# Patient Record
Sex: Female | Born: 1960 | Race: Black or African American | Hispanic: No | Marital: Single | Smoking: Never smoker
Health system: Southern US, Community
[De-identification: ages and names within clinical notes are randomized; demographics above are authoritative.]

## PROBLEM LIST (undated history)

## (undated) DIAGNOSIS — J45909 Unspecified asthma, uncomplicated: Secondary | ICD-10-CM

## (undated) DIAGNOSIS — J302 Other seasonal allergic rhinitis: Secondary | ICD-10-CM

## (undated) DIAGNOSIS — R030 Elevated blood-pressure reading, without diagnosis of hypertension: Secondary | ICD-10-CM

## (undated) DIAGNOSIS — K219 Gastro-esophageal reflux disease without esophagitis: Secondary | ICD-10-CM

## (undated) DIAGNOSIS — N6099 Unspecified benign mammary dysplasia of unspecified breast: Secondary | ICD-10-CM

## (undated) DIAGNOSIS — J069 Acute upper respiratory infection, unspecified: Secondary | ICD-10-CM

## (undated) HISTORY — DX: Acute upper respiratory infection, unspecified: J06.9

## (undated) HISTORY — PX: CHOLECYSTECTOMY: SHX55

## (undated) HISTORY — DX: Gastro-esophageal reflux disease without esophagitis: K21.9

---

## 1987-05-14 HISTORY — PX: CHOLECYSTECTOMY: SHX55

## 1998-02-27 ENCOUNTER — Ambulatory Visit (HOSPITAL_COMMUNITY): Admission: RE | Admit: 1998-02-27 | Discharge: 1998-02-27 | Payer: Self-pay | Admitting: Obstetrics and Gynecology

## 1999-07-23 ENCOUNTER — Other Ambulatory Visit: Admission: RE | Admit: 1999-07-23 | Discharge: 1999-07-23 | Payer: Self-pay | Admitting: Obstetrics and Gynecology

## 2000-08-28 ENCOUNTER — Other Ambulatory Visit: Admission: RE | Admit: 2000-08-28 | Discharge: 2000-08-28 | Payer: Self-pay | Admitting: Obstetrics and Gynecology

## 2007-01-05 ENCOUNTER — Encounter: Admission: RE | Admit: 2007-01-05 | Discharge: 2007-01-05 | Payer: Self-pay | Admitting: Obstetrics and Gynecology

## 2011-04-15 ENCOUNTER — Emergency Department (HOSPITAL_COMMUNITY): Payer: BC Managed Care – PPO

## 2011-04-15 ENCOUNTER — Emergency Department (HOSPITAL_COMMUNITY)
Admission: EM | Admit: 2011-04-15 | Discharge: 2011-04-15 | Disposition: A | Discharge status: Home / Self Care | Payer: BC Managed Care – PPO | Attending: Emergency Medicine | Admitting: Emergency Medicine

## 2011-04-15 ENCOUNTER — Other Ambulatory Visit: Payer: Self-pay

## 2011-04-15 DIAGNOSIS — R0789 Other chest pain: Secondary | ICD-10-CM | POA: Insufficient documentation

## 2011-04-15 DIAGNOSIS — R0602 Shortness of breath: Secondary | ICD-10-CM | POA: Insufficient documentation

## 2011-04-15 DIAGNOSIS — R091 Pleurisy: Secondary | ICD-10-CM | POA: Insufficient documentation

## 2011-04-15 LAB — D-DIMER, QUANTITATIVE: D-Dimer, Quant: <0.22 ug/mL-FEU (ref 0.00–0.48)

## 2011-04-15 MED ORDER — KETOROLAC TROMETHAMINE 60 MG/2ML IM SOLN
60.0000 mg | Freq: Once | INTRAMUSCULAR | Status: AC
  Administered 2011-04-15: 60 mg via INTRAMUSCULAR
  Filled 2011-04-15: qty 2

## 2011-04-15 MED ORDER — OXYCODONE-ACETAMINOPHEN 5-325 MG PO TABS: 2.0000 | ORAL_TABLET | ORAL | Status: AC | PRN

## 2011-04-15 NOTE — ED Provider Notes (Signed)
History     CSN: [id number] Arrival date & time: 04/15/2011 11:18 AM   First MD Initiated Contact with Patient 04/15/11 1635      Chief Complaint  Patient presents with  . Chest Pain  . Shortness of Breath    (Consider location/radiation/quality/duration/timing/severity/associated sxs/prior treatment) Patient is a 50 y.o. female presenting with chest pain and shortness of breath. The history is provided by the patient.  Chest Pain Pertinent negatives for primary symptoms include no fever, no cough and no abdominal pain.  Pertinent negatives for associated symptoms include no diaphoresis and no numbness.    Shortness of Breath  Associated symptoms include chest pain. Pertinent negatives include no fever and no cough.   the patient is a 50 year old female, with no past medical history, who takes no prescribed medications.  She presents to the emergency department complaining of left sided, pleuritic chest pain, with shortness of breath since yesterday evening.  She denies nausea, vomiting, fevers, chills, cough, diaphoresis, or leg swelling.  3 days ago.  She had intermittent left leg cramps followed by right leg cramps.  They have resolved.  She denies taking birth control pills.  She denies recent trips or surgeries.  History reviewed. No pertinent past medical history.  Past Surgical History  Procedure Date  . Cholecystectomy   . Cesarean section 1988    History reviewed. No pertinent family history.  History  Substance Use Topics  . Smoking status: Never Smoker   . Smokeless tobacco: Not on file  . Alcohol Use: Yes    OB History    Grav Para Term Preterm Abortions TAB SAB Ect Mult Living                  Review of Systems  Constitutional: Negative for fever, chills and diaphoresis.  HENT: Negative for congestion and neck pain.   Eyes: Negative for redness.  Respiratory: Negative for cough and chest tightness.   Cardiovascular: Positive for chest pain. Negative  for leg swelling.  Gastrointestinal: Negative for abdominal pain.  Genitourinary: Negative for dysuria.  Musculoskeletal: Negative for back pain.  Skin: Negative for rash.  Neurological: Negative for light-headedness, numbness and headaches.  Psychiatric/Behavioral: Negative for confusion.    Allergies  Penicillins  Home Medications   Current Outpatient Rx  Name Route Sig Dispense Refill  . CETIRIZINE HCL 10 MG PO TABS Oral Take 10 mg by mouth daily.        BP 130/83  Pulse 78  Temp(Src) 97.9 F (36.6 C) (Oral)  Resp 16  SpO2 100%  Physical Exam  Constitutional: She is oriented to person, place, and time. She appears well-developed and well-nourished.  HENT:  Head: Normocephalic and atraumatic.  Eyes: Pupils are equal, round, and reactive to light.  Neck: Normal range of motion.  Cardiovascular: Normal rate, regular rhythm and normal heart sounds.   No murmur heard. Pulmonary/Chest: Effort normal and breath sounds normal. No respiratory distress. She has no wheezes. She has no rales.  Abdominal: Soft. She exhibits no distension and no mass. There is no tenderness. There is no rebound and no guarding.  Musculoskeletal: Normal range of motion. She exhibits no edema and no tenderness.  Neurological: She is alert and oriented to person, place, and time. No cranial nerve deficit.  Skin: Skin is warm and dry. No rash noted. No erythema.  Psychiatric: She has a normal mood and affect. Her behavior is normal.    ED Course  Procedures (including critical care time)  50 year old female, with no risk factors for coronary artery disease, or pulmonary embolism, presents with pleuritic chest pain since last night.  Her EKG does not show a STEMI or irregular to her heart beat.  We will perform a chest x-ray, and laboratory testing, to evaluate for pulmonary embolism, or ACS. We will give her IM Toradol for her symptoms   Labs Reviewed  D-DIMER, QUANTITATIVE  I-STAT TROPONIN I    No results found.   No diagnosis found.  ED ECG REPORT   Date: 04/15/2011  EKG Time: 1124 Rate: 72  Rhythm: normal sinus rhythm,   Intervals:none  ST&T Change:nonspecific T wave changes  Axis: nl  Narrative Interpretation:      Normal sinus rhythm with nonspecific T-wave changes         MDM   Pleuritic chest pain No signs of pulmonary embolism, or ACS.  No pneumonia.  No respiratory distress.  No signs of systemic illness.       Nicholes Stairs, MD 04/15/11 [phone]

## 2011-04-15 NOTE — ED Notes (Signed)
Unable to locate patient. Called in waiting room, lobby, and triage.

## 2011-04-15 NOTE — ED Notes (Signed)
Patient presents with substernal to left sided chest pain upon inspiration.  Patient reporting mild SOB.  Lungs clear throughout, skin warm, dry and intact

## 2011-04-15 NOTE — ED Notes (Signed)
Patient presents with left sided chest pain since last night with tingling to left arm.  Patient also reports pain is worse upon inspiration and movement and reports pain 6/10, denies cough.

## 2011-04-15 NOTE — ED Notes (Signed)
Pt waiting outside ER Cell # [phone].   Call when room is available

## 2011-04-15 NOTE — ED Notes (Signed)
Pt called by RN on cell phone. Pt stated she was on her way in. Still unable to locate patient after calling in waiting room

## 2014-07-06 ENCOUNTER — Ambulatory Visit
Admission: RE | Admit: 2014-07-06 | Discharge: 2014-07-06 | Disposition: A | Discharge status: Home / Self Care | Payer: Self-pay | Source: Ambulatory Visit | Attending: Family Medicine | Admitting: Family Medicine

## 2014-07-06 ENCOUNTER — Other Ambulatory Visit: Payer: Self-pay | Admitting: Family Medicine

## 2014-07-06 DIAGNOSIS — R52 Pain, unspecified: Secondary | ICD-10-CM

## 2014-07-08 ENCOUNTER — Other Ambulatory Visit: Payer: Self-pay | Admitting: Cardiology

## 2014-07-08 DIAGNOSIS — Z139 Encounter for screening, unspecified: Secondary | ICD-10-CM

## 2014-07-28 ENCOUNTER — Ambulatory Visit
Admission: RE | Admit: 2014-07-28 | Discharge: 2014-07-28 | Disposition: A | Discharge status: Home / Self Care | Payer: No Typology Code available for payment source | Source: Ambulatory Visit | Attending: Cardiology | Admitting: Cardiology

## 2014-07-28 DIAGNOSIS — Z139 Encounter for screening, unspecified: Secondary | ICD-10-CM

## 2014-08-02 ENCOUNTER — Other Ambulatory Visit: Payer: Self-pay | Admitting: Cardiology

## 2014-08-02 DIAGNOSIS — Z8249 Family history of ischemic heart disease and other diseases of the circulatory system: Secondary | ICD-10-CM

## 2014-08-03 ENCOUNTER — Ambulatory Visit
Admission: RE | Admit: 2014-08-03 | Discharge: 2014-08-03 | Disposition: A | Discharge status: Home / Self Care | Payer: BLUE CROSS/BLUE SHIELD | Source: Ambulatory Visit | Attending: Cardiology | Admitting: Cardiology

## 2014-08-03 DIAGNOSIS — Z8249 Family history of ischemic heart disease and other diseases of the circulatory system: Secondary | ICD-10-CM

## 2015-10-07 IMAGING — CT CT HEART SCORING
1 of 2 series · 11 of 20 positions shown, 14 images · non-contrast
Comparison: None.

EXAM:
OVER-READ INTERPRETATION  CT CHEST

The following report is an over-read performed by radiologist Dr.
over-read does not include interpretation of cardiac or coronary
anatomy or pathology. The coronary calcium score/coronary CTA
interpretation by the cardiologist is attached.

[Series 2: smartscore - gated 0.4 sec · axial · 0.49mm/px · z∈[-197,-97]mm · 11 of 48 slices shown, 14 images]
[im 4/48  vessel]
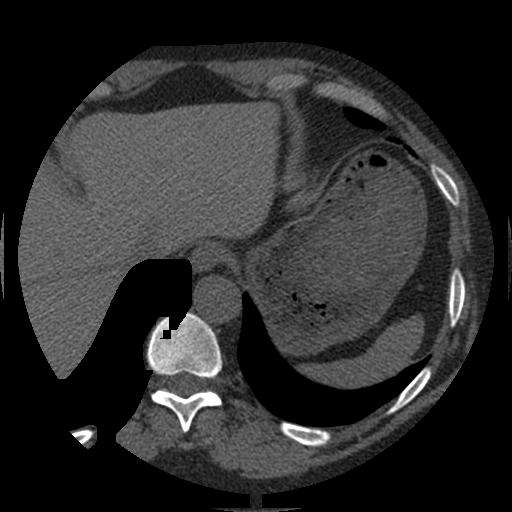
[im 4/48  lung]
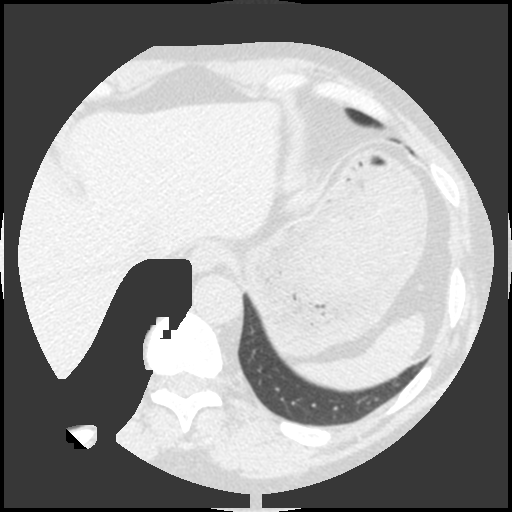
[im 8/48  vessel]
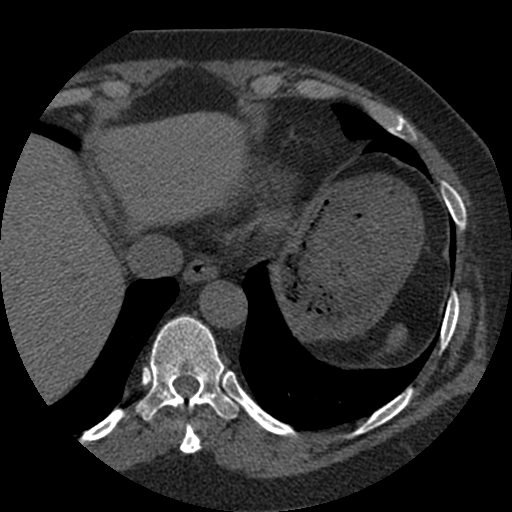
[im 12/48  vessel]
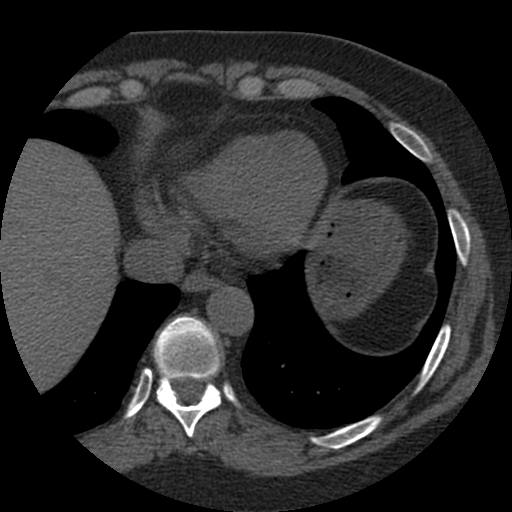
[im 16/48  vessel]
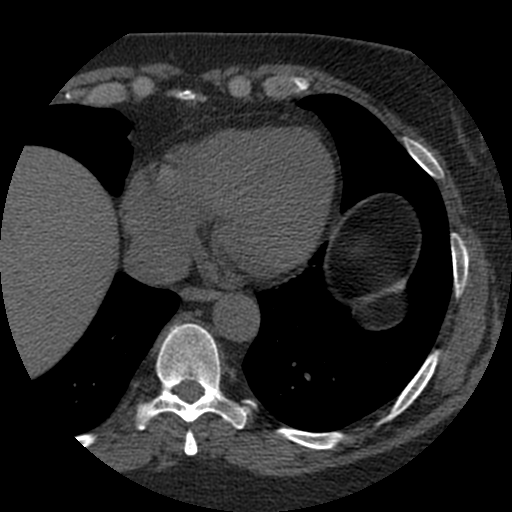
[im 20/48  vessel]
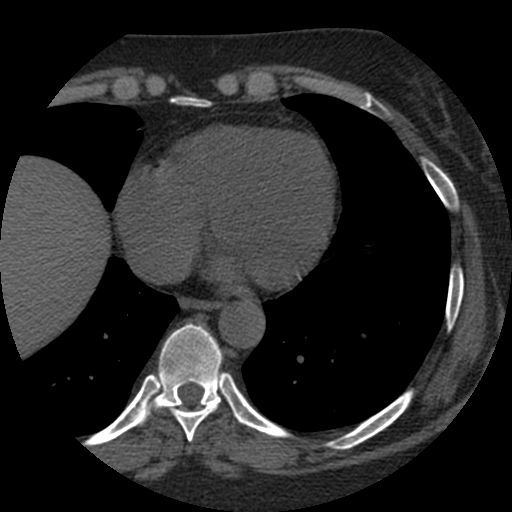
[im 20/48  lung]
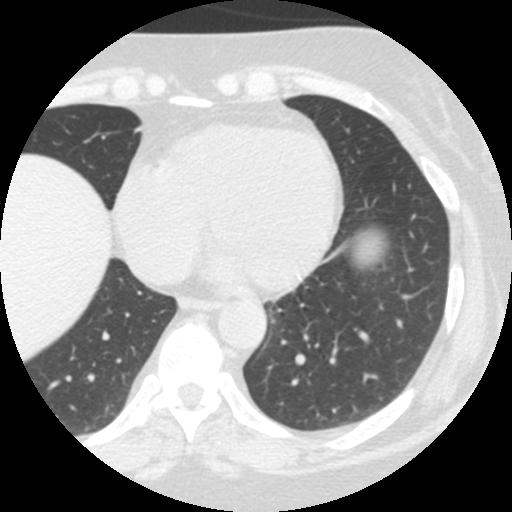
[im 24/48  vessel]
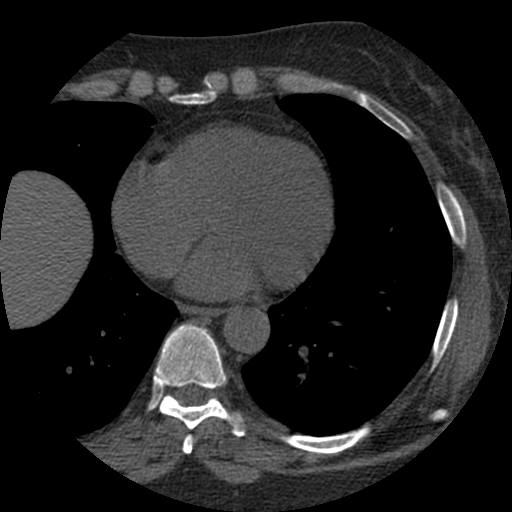
[im 28/48  vessel]
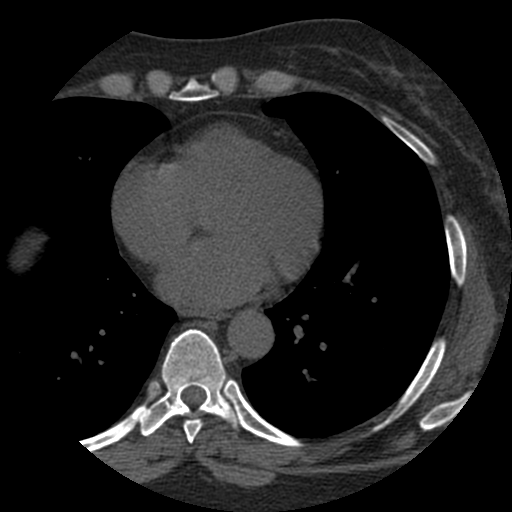
[im 32/48  vessel]
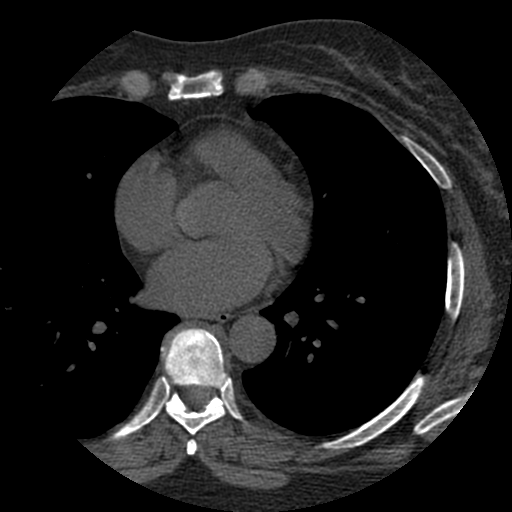
[im 36/48  vessel]
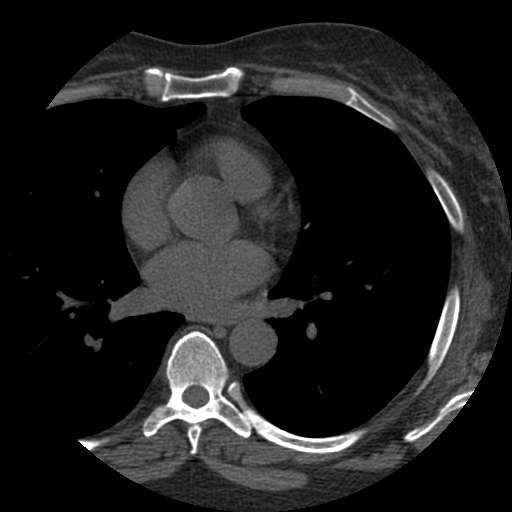
[im 36/48  lung]
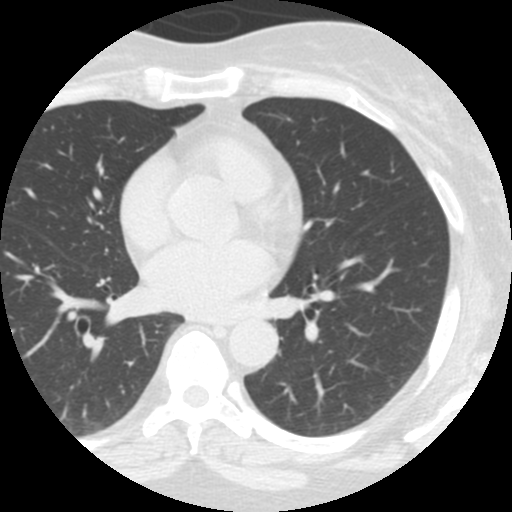
[im 40/48  vessel]
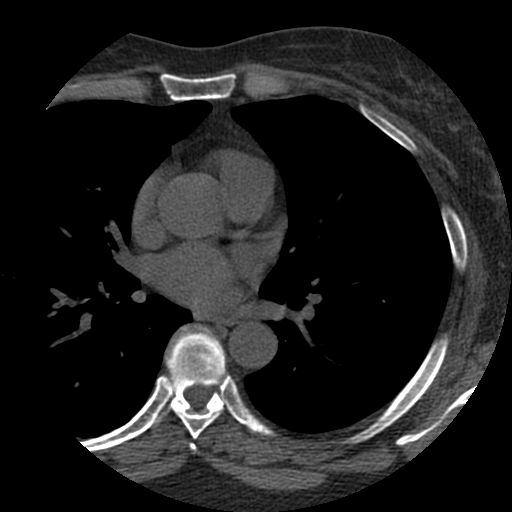
[im 44/48  vessel]
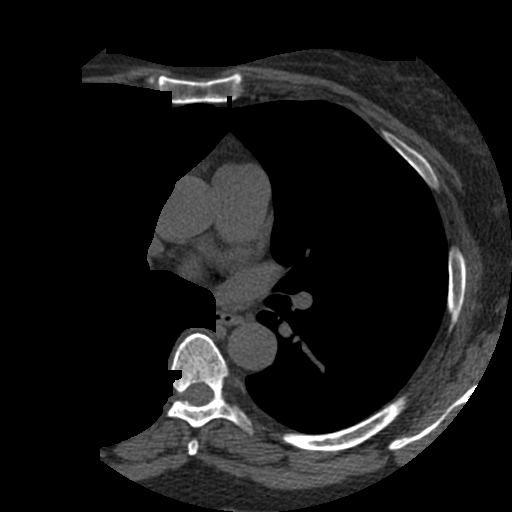

[11 of 20 positions shown; findings below may reference images not displayed]

FINDINGS: Lungs/Pleura: No airspace consolidation or atelectasis. No pulmonary
mass identified.

Upper Abdomen: Incidental imaging through the upper abdomen is
unremarkable.

Musculoskeletal: Unremarkable.
IMPRESSION: Negative exam.

## 2016-01-28 ENCOUNTER — Emergency Department (HOSPITAL_COMMUNITY): Payer: 59

## 2016-01-28 ENCOUNTER — Encounter (HOSPITAL_COMMUNITY): Payer: Self-pay

## 2016-01-28 ENCOUNTER — Emergency Department (HOSPITAL_COMMUNITY)
Admission: EM | Admit: 2016-01-28 | Discharge: 2016-01-28 | Disposition: A | Discharge status: Home / Self Care | Payer: 59 | Attending: Emergency Medicine | Admitting: Emergency Medicine

## 2016-01-28 DIAGNOSIS — R42 Dizziness and giddiness: Secondary | ICD-10-CM | POA: Diagnosis present

## 2016-01-28 DIAGNOSIS — R072 Precordial pain: Secondary | ICD-10-CM | POA: Diagnosis not present

## 2016-01-28 DIAGNOSIS — Z7982 Long term (current) use of aspirin: Secondary | ICD-10-CM | POA: Insufficient documentation

## 2016-01-28 DIAGNOSIS — J302 Other seasonal allergic rhinitis: Secondary | ICD-10-CM | POA: Diagnosis not present

## 2016-01-28 LAB — BASIC METABOLIC PANEL
Anion gap: 12 (ref 5–15)
BUN: 11 mg/dL (ref 6–20)
CO2: 22 mmol/L (ref 22–32)
CREATININE: 1.05 mg/dL — AB (ref 0.44–1.00)
Calcium: 9.3 mg/dL (ref 8.9–10.3)
Chloride: 102 mmol/L (ref 101–111)
GFR calc Af Amer: >60 mL/min (ref >60)
GFR, EST NON AFRICAN AMERICAN: 58 mL/min — AB (ref >60)
GLUCOSE: 118 mg/dL — AB (ref 65–99)
POTASSIUM: 3.7 mmol/L (ref 3.5–5.1)
Sodium: 136 mmol/L (ref 135–145)

## 2016-01-28 LAB — CBC
HEMATOCRIT: 45.6 % (ref 36.0–46.0)
Hemoglobin: 15.2 g/dL — ABNORMAL HIGH (ref 12.0–15.0)
MCH: 30.3 pg (ref 26.0–34.0)
MCHC: 33.3 g/dL (ref 30.0–36.0)
MCV: 90.8 fL (ref 78.0–100.0)
PLATELETS: 381 10*3/uL (ref 150–400)
RBC: 5.02 MIL/uL (ref 3.87–5.11)
RDW: 13.8 % (ref 11.5–15.5)
WBC: 10.6 10*3/uL — ABNORMAL HIGH (ref 4.0–10.5)

## 2016-01-28 LAB — I-STAT TROPONIN, ED: TROPONIN I, POC: 0 ng/mL (ref 0.00–0.08)

## 2016-01-28 LAB — CBG MONITORING, ED: Glucose-Capillary: 117 mg/dL — ABNORMAL HIGH (ref 65–99)

## 2016-01-28 MED ORDER — FAMOTIDINE 20 MG PO TABS
20.0000 mg | ORAL_TABLET | Freq: Once | ORAL | Status: AC
  Administered 2016-01-28: 20 mg via ORAL
  Filled 2016-01-28: qty 1

## 2016-01-28 MED ORDER — KETOROLAC TROMETHAMINE 60 MG/2ML IM SOLN
60.0000 mg | Freq: Once | INTRAMUSCULAR | Status: AC
  Administered 2016-01-28: 60 mg via INTRAMUSCULAR
  Filled 2016-01-28: qty 2

## 2016-01-28 MED ORDER — METHYLPREDNISOLONE 4 MG PO TBPK: ORAL_TABLET | ORAL | 0 refills | Status: DC

## 2016-01-28 MED ORDER — LEVOCETIRIZINE DIHYDROCHLORIDE 5 MG PO TABS: 5.0000 mg | ORAL_TABLET | Freq: Every evening | ORAL | 1 refills | Status: DC

## 2016-01-28 NOTE — ED Provider Notes (Addendum)
I saw and evaluated the patient, reviewed the resident's note and I agree with the findings and plan.  Pertinent History: The patient is a 55 year old female, she presents with multiple symptoms including runny nose, postnasal drip, sneezing, itchy watery eyes, seems to be getting worse, no fevers or chills, no coughing of any significance  On exam the patient has a small amount of rhinorrhea, she has clear lung sounds, clear heart sounds, clear tympanic membranes, clear oropharynx without erythema exudate asymmetry or hypertrophy. She appears well  Likely related to seasonal allergies or early upper respiratory infection, Medrol Dosepak, antihistamine, discharge, patient agreeable to the plan. Reviewed x-ray EKG and laboratory workup, unremarkable.  I personally interpreted the EKG as well as the resident and agree with the interpretation on the resident's chart.  Final diagnoses:  Seasonal allergies      Noemi Chapel, MD 01/28/16 2312    Noemi Chapel, MD 01/31/16 [phone]

## 2016-01-28 NOTE — ED Notes (Signed)
Pt to xray

## 2016-01-28 NOTE — ED Triage Notes (Addendum)
Per Pt, Pt reports feeling dizziness on and off for the last weak with generalized weakness. Complains of bilateral hand tingling. Pt reports feeling worse today.

## 2016-01-28 NOTE — ED Provider Notes (Signed)
[facility] DEPT Provider Note   CSN: [id number] Arrival date & time: 01/28/16  1655   History   Chief Complaint Chief Complaint  Patient presents with  . Dizziness    HPI Vanessa Gordon is a 55 y.o. female.  The history is provided by the patient and medical records.   55 year old female with history of seasonal allergies presenting with fatigue, runny nose, lightheadedness. Onset about a week ago. Worsening slightly since onset. Waxing and waning. Patient states symptoms get worse when her allergy symptoms are worse. Described as runny nose with postnasal drip, itchy watery eyes, slight dry nonproductive cough and mild shortness of breath. Not alleviated by taking her daily Zyrtec. Associated with a slight twinge in her substernal area that is reproducible with palpation of her chest wall. She states she's had this same kind of pain in the past, was seen here and given Toradol with complete resolution at that time. She denies leg swelling, history of DVT/PE, hormone use, fevers, nausea, vomiting. Does not have any frank vertigo, no headaches, no vision changes, no focal numbness or weakness or gait disturbance.   History reviewed. No pertinent past medical history.  There are no active problems to display for this patient.   Past Surgical History:  Procedure Laterality Date  . CESAREAN SECTION  1988  . CHOLECYSTECTOMY      OB History    No data available       Home Medications    Prior to Admission medications   Medication Sig Start Date End Date Taking? Authorizing Provider  aspirin EC 81 MG tablet Take 81 mg by mouth every morning.   Yes Historical Provider, MD  Black Cohosh 40 MG CAPS Take 40 mg by mouth daily.   Yes Historical Provider, MD  cetirizine (ZYRTEC) 10 MG tablet Take 10 mg by mouth daily.     Yes Historical Provider, MD  Flaxseed, Linseed, (FLAXSEED OIL) 1200 MG CAPS Take 1,200 mg by mouth daily.   Yes Historical Provider, MD  folic acid (FOLVITE) 1 MG  tablet Take 1 mg by mouth daily.  01/27/16  Yes Historical Provider, MD  Multiple Vitamin (MULTIVITAMIN) tablet Take 1 tablet by mouth daily.   Yes Historical Provider, MD  levocetirizine (XYZAL ALLERGY 24HR) 5 MG tablet Take 1 tablet (5 mg total) by mouth every evening. 01/28/16   Ivin Booty, MD  methylPREDNISolone (MEDROL DOSEPAK) 4 MG TBPK tablet Take taper as instructed 01/28/16   Ivin Booty, MD    Family History No family history on file.  Social History Social History  Substance Use Topics  . Smoking status: Never Smoker  . Smokeless tobacco: Never Used  . Alcohol use Yes     Comment: Occasionally      Allergies   Penicillins   Review of Systems Review of Systems  Constitutional: Positive for fatigue. Negative for fever.  HENT: Negative for ear pain.   Eyes: Positive for itching. Negative for visual disturbance.  Respiratory: Positive for shortness of breath.   Cardiovascular: Positive for chest pain.  Gastrointestinal: Negative for abdominal pain, nausea and vomiting.  Genitourinary: Negative for dysuria.  Musculoskeletal: Negative for back pain.  Skin: Negative for rash.  Allergic/Immunologic: Negative for immunocompromised state.  Neurological: Positive for light-headedness. Negative for syncope, facial asymmetry, speech difficulty, weakness, numbness and headaches.  Hematological: Does not bruise/bleed easily.  All other systems reviewed and are negative.    Physical Exam Updated Vital Signs BP 154/90 (BP Location: Right Arm)  Pulse 77   Temp 98 F (36.7 C) (Oral)   Resp 16   Ht 5\' 8"  (1.727 m)   Wt 73.9 kg   SpO2 90%   BMI 24.78 kg/m   Physical Exam  Constitutional: She is oriented to person, place, and time. She appears well-developed and well-nourished. No distress.  HENT:  Head: Normocephalic and atraumatic.  Mouth/Throat: Oropharynx is clear and moist.  bilat TM clear, no effusion, no erythema. Mild rhinorrhea   Eyes: Conjunctivae are  normal.  Neck: Neck supple.  Cardiovascular: Normal rate and regular rhythm.   No murmur heard. Pulmonary/Chest: Effort normal and breath sounds normal. No respiratory distress. She exhibits tenderness (TTP left lower sternum near midline anteriorly).  Abdominal: Soft. There is no tenderness.  Musculoskeletal: She exhibits no edema.  Neurological: She is alert and oriented to person, place, and time. She has normal strength and normal reflexes. She displays no tremor. No cranial nerve deficit or sensory deficit. She displays a negative Romberg sign. Coordination (normal FTN, HTS) and gait normal.  Skin: Skin is warm and dry.  Psychiatric: She has a normal mood and affect.  Nursing note and vitals reviewed.    ED Treatments / Results  Labs (all labs ordered are listed, but only abnormal results are displayed) Labs Reviewed  BASIC METABOLIC PANEL - Abnormal; Notable for the following:       Result Value   Glucose, Bld 118 (*)    Creatinine, Ser 1.05 (*)    GFR calc non Af Amer 58 (*)    All other components within normal limits  CBC - Abnormal; Notable for the following:    WBC 10.6 (*)    Hemoglobin 15.2 (*)    All other components within normal limits  CBG MONITORING, ED - Abnormal; Notable for the following:    Glucose-Capillary 117 (*)    All other components within normal limits  I-STAT TROPOININ, ED    EKG  EKG Interpretation  Date/Time:  Sunday January 28 2016 17:00:57 EDT Ventricular Rate:  98 PR Interval:  168 QRS Duration: 76 QT Interval:  346 QTC Calculation: 441 R Axis:   50 Text Interpretation:  Normal sinus rhythm Possible Left atrial enlargement Borderline ECG Since last tracing rate faster Confirmed by MILLER  MD, BRIAN ([id number]) on 01/28/2016 11:04:22 PM       Radiology No results found.  Procedures Procedures (including critical care time)  Medications Ordered in ED Medications  ketorolac (TORADOL) injection 60 mg (60 mg Intramuscular Given  01/28/16 2206)  famotidine (PEPCID) tablet 20 mg (20 mg Oral Given 01/28/16 2206)     Initial Impression / Assessment and Plan / ED Course  I have reviewed the triage vital signs and the nursing notes.  Pertinent labs & imaging results that were available during my care of the patient were reviewed by me and considered in my medical decision making (see chart for details).  Clinical Course    55 year old female with history of seasonal allergies presenting with fatigue, runny nose, lightheadedness as above for the past week.   She is afebrile with stable vital signs.electrolytes, glucose unremarkable. She is not anemic. She has no signs or symptoms concerning for infection. Neurologic exam is normal, history is not consistent with vertigo. Chest x-ray is negative. EKG notable for possible atrial enlargement, similar to prior. Single screening troponin sent and is negative after several days of this chest discomfort, which is reproducible on exam and likely representative of chest wall  pain/pleurisy. Low risk for DVT/PE per well's.   Overall symptoms consistent with respiratory symptoms and fatigue possibly secondary to seasonal allergy exacerbation versus possible early viral URI as patient states she has been exposed to many sick contacts. She is ready on Zyrtec. We will trial Xyzal and a medrol dose pack for further sx management. If symptoms do not resolve she will follow up with her primary care physician. Patient agreeable with plan. Discharged in stable condition.  Case discussed with Dr. Sabra Heck who oversaw management of this patient.   Final Clinical Impressions(s) / ED Diagnoses   Final diagnoses:  Seasonal allergies    New Prescriptions Discharge Medication List as of 01/28/2016 11:14 PM    START taking these medications   Details  levocetirizine (XYZAL ALLERGY 24HR) 5 MG tablet Take 1 tablet (5 mg total) by mouth every evening., Starting Sun 01/28/2016, Print      methylPREDNISolone (MEDROL DOSEPAK) 4 MG TBPK tablet Take taper as instructed, Print         Ivin Booty, MD 01/29/16 0031    Noemi Chapel, MD 01/31/16 [phone]

## 2017-03-20 ENCOUNTER — Emergency Department (HOSPITAL_COMMUNITY): Payer: 59

## 2017-03-20 ENCOUNTER — Emergency Department (HOSPITAL_COMMUNITY)
Admission: EM | Admit: 2017-03-20 | Discharge: 2017-03-20 | Disposition: A | Discharge status: Home / Self Care | Payer: 59 | Attending: Emergency Medicine | Admitting: Emergency Medicine

## 2017-03-20 ENCOUNTER — Encounter (HOSPITAL_COMMUNITY): Payer: Self-pay | Admitting: *Deleted

## 2017-03-20 DIAGNOSIS — R0789 Other chest pain: Secondary | ICD-10-CM | POA: Diagnosis not present

## 2017-03-20 DIAGNOSIS — Z7982 Long term (current) use of aspirin: Secondary | ICD-10-CM | POA: Insufficient documentation

## 2017-03-20 DIAGNOSIS — Z79899 Other long term (current) drug therapy: Secondary | ICD-10-CM | POA: Insufficient documentation

## 2017-03-20 DIAGNOSIS — R531 Weakness: Secondary | ICD-10-CM | POA: Diagnosis present

## 2017-03-20 LAB — CBC
HEMATOCRIT: 43.6 % (ref 36.0–46.0)
HEMOGLOBIN: 15.1 g/dL — AB (ref 12.0–15.0)
MCH: 30.8 pg (ref 26.0–34.0)
MCHC: 34.6 g/dL (ref 30.0–36.0)
MCV: 89 fL (ref 78.0–100.0)
Platelets: 394 10*3/uL (ref 150–400)
RBC: 4.9 MIL/uL (ref 3.87–5.11)
RDW: 13.1 % (ref 11.5–15.5)
WBC: 10.3 10*3/uL (ref 4.0–10.5)

## 2017-03-20 LAB — BASIC METABOLIC PANEL
ANION GAP: 10 (ref 5–15)
BUN: 14 mg/dL (ref 6–20)
CALCIUM: 9.9 mg/dL (ref 8.9–10.3)
CHLORIDE: 103 mmol/L (ref 101–111)
CO2: 24 mmol/L (ref 22–32)
Creatinine, Ser: 1.17 mg/dL — ABNORMAL HIGH (ref 0.44–1.00)
GFR calc non Af Amer: 51 mL/min — ABNORMAL LOW (ref >60)
GFR, EST AFRICAN AMERICAN: 59 mL/min — AB (ref >60)
GLUCOSE: 111 mg/dL — AB (ref 65–99)
Potassium: 5.3 mmol/L — ABNORMAL HIGH (ref 3.5–5.1)
Sodium: 137 mmol/L (ref 135–145)

## 2017-03-20 LAB — TROPONIN I: Troponin I: <0.03 ng/mL (ref <0.03)

## 2017-03-20 MED ORDER — ESOMEPRAZOLE MAGNESIUM 20 MG PO CPDR: 20.0000 mg | DELAYED_RELEASE_CAPSULE | Freq: Every day | ORAL | 0 refills | Status: DC

## 2017-03-20 MED ORDER — GI COCKTAIL ~~LOC~~
30.0000 mL | Freq: Once | ORAL | Status: AC
  Administered 2017-03-20: 30 mL via ORAL
  Filled 2017-03-20: qty 30

## 2017-03-20 MED ORDER — KETOROLAC TROMETHAMINE 30 MG/ML IJ SOLN: 15.0000 mg | Freq: Once | INTRAMUSCULAR | Status: DC

## 2017-03-20 MED ORDER — KETOROLAC TROMETHAMINE 15 MG/ML IJ SOLN
15.0000 mg | Freq: Once | INTRAMUSCULAR | Status: AC
  Administered 2017-03-20: 15 mg via INTRAMUSCULAR
  Filled 2017-03-20: qty 1

## 2017-03-20 MED ORDER — SODIUM CHLORIDE 0.9 % IV BOLUS (SEPSIS): 1000.0000 mL | Freq: Once | INTRAVENOUS | Status: DC

## 2017-03-20 NOTE — ED Notes (Signed)
Unable to collect labs at this time PA in the room talking with patient I am waiting to see if he is going to add something on

## 2017-03-20 NOTE — ED Notes (Signed)
ED Provider at bedside. 

## 2017-03-20 NOTE — ED Notes (Signed)
Pt refused IV with bolus and chest x-ray.  Made MD aware. Orders modified.

## 2017-03-20 NOTE — Discharge Instructions (Signed)
As discussed, your evaluation today has been largely reassuring.  But, it is important that you monitor your condition carefully, and do not hesitate to return to the ED if you develop new, or concerning changes in your condition. ? ?Otherwise, please follow-up with your physician for appropriate ongoing care. ? ?

## 2017-03-20 NOTE — ED Notes (Signed)
Pt requesting to be discharged.  Made MD aware.

## 2017-03-20 NOTE — ED Triage Notes (Signed)
Pt complains of dizziness, weakness, nausea, shortness of breath, heartburn for the past week. Pt went to PCP last week and was prescribed antibiotics for a sinus infection but states she does not feel better.

## 2017-03-20 NOTE — ED Notes (Signed)
Pt ambulatory and independent at discharge.  Verbalized understanding of discharge instructions 

## 2017-03-20 NOTE — ED Notes (Signed)
Only obtained the BMP

## 2017-03-20 NOTE — ED Provider Notes (Signed)
[facility] DEPT Provider Note   CSN: [id number] Arrival date & time: 03/20/17  1749     History   Chief Complaint Chief Complaint  Patient presents with  . Weakness  . Dizziness    HPI Vanessa Gordon is a 56 y.o. female.  HPI  Patient presents with concern of dizziness, weakness, dyspnea, sternal chest discomfort. Onset was 1 week ago, and initially patient had only sternal discomfort radiating towards her head. About 2 days later the patient developed the additional complaints including occasional tingling in her right arm, generalized fatigue, weakness. No medication taken for relief, including no antacid. However, patient does note a history of heartburn, has previously taken PPI, and H2. Over the interval week of illness she has had no exertional pain, no other chest pain, no abdominal pain, no vomiting, no diarrhea, no fever, no chills.   History reviewed. No pertinent past medical history.  There are no active problems to display for this patient.   Past Surgical History:  Procedure Laterality Date  . CESAREAN SECTION  1988  . CHOLECYSTECTOMY      OB History    No data available       Home Medications    Prior to Admission medications   Medication Sig Start Date End Date Taking? Authorizing Provider  aspirin EC 81 MG tablet Take 81 mg by mouth every morning.   Yes [provider]  Black Cohosh 40 MG CAPS Take 40 mg by mouth daily.   Yes [provider]  cetirizine (ZYRTEC) 10 MG tablet Take 10 mg by mouth daily.     Yes [provider]  Fexofenadine-Pseudoephedrine (ALLEGRA-D 12 HOUR PO) Take 1 tablet daily as needed by mouth (allergies).   Yes [provider]  Flaxseed, Linseed, (FLAXSEED OIL) 1200 MG CAPS Take 1,200 mg by mouth daily.   Yes [provider]  folic acid (FOLVITE) 1 MG tablet Take 1 mg by mouth daily.  01/27/16  Yes [provider]  Multiple Vitamin  (MULTIVITAMIN) tablet Take 1 tablet by mouth daily.   Yes [provider]  sulfamethoxazole-trimethoprim (BACTRIM DS,SEPTRA DS) 800-160 MG tablet TK 1 T PO BID FOR 10 DAYS 03/14/17  Yes [provider]  esomeprazole (NEXIUM) 20 MG capsule Take 1 capsule (20 mg total) daily by mouth. 03/20/17   Carmin Muskrat, MD  levocetirizine (XYZAL ALLERGY 24HR) 5 MG tablet Take 1 tablet (5 mg total) by mouth every evening. Patient not taking: Reported on 03/20/2017 01/28/16   Ivin Booty, MD  methylPREDNISolone (MEDROL DOSEPAK) 4 MG TBPK tablet Take taper as instructed Patient not taking: Reported on 03/20/2017 01/28/16   Ivin Booty, MD    Family History No family history on file.  Social History Social History   Tobacco Use  . Smoking status: Never Smoker  . Smokeless tobacco: Never Used  Substance Use Topics  . Alcohol use: Yes    Comment: Occasionally   . Drug use: No     Allergies   Penicillins   Review of Systems Review of Systems  Constitutional:       Per HPI, otherwise negative  HENT:       Per HPI, otherwise negative  Respiratory:       Per HPI, otherwise negative  Cardiovascular:       Per HPI, otherwise negative  Gastrointestinal: Negative for vomiting.  Endocrine:       Negative aside from HPI  Genitourinary:       Neg  aside from HPI   Musculoskeletal:       Per HPI, otherwise negative  Skin: Negative.   Neurological: Negative for syncope.     Physical Exam Updated Vital Signs BP (!) 145/86 (BP Location: Left Arm)   Pulse 70   Temp 98.1 F (36.7 C) (Oral)   Resp 12   SpO2 100%   Physical Exam  Constitutional: She is oriented to person, place, and time. She appears well-developed and well-nourished. No distress.  HENT:  Head: Normocephalic and atraumatic.  Eyes: Conjunctivae and EOM are normal.  Cardiovascular: Normal rate and regular rhythm.  Pulmonary/Chest: Effort normal and breath sounds normal. No stridor. No respiratory  distress.  Abdominal: She exhibits no distension. There is no tenderness.  Musculoskeletal: She exhibits no edema.  Neurological: She is alert and oriented to person, place, and time. No cranial nerve deficit.  Skin: Skin is warm and dry.  Psychiatric: She has a normal mood and affect.  Nursing note and vitals reviewed.    ED Treatments / Results  Labs (all labs ordered are listed, but only abnormal results are displayed) Labs Reviewed  BASIC METABOLIC PANEL - Abnormal; Notable for the following components:      Result Value   Potassium 5.3 (*)    Glucose, Bld 111 (*)    Creatinine, Ser 1.17 (*)    GFR calc non Af Amer 51 (*)    GFR calc Af Amer 59 (*)    All other components within normal limits  CBC - Abnormal; Notable for the following components:   Hemoglobin 15.1 (*)    All other components within normal limits  TROPONIN I    EKG  EKG Interpretation  Date/Time:  Thursday March 20 2017 18:23:46 EST Ventricular Rate:  76 PR Interval:    QRS Duration: 85 QT Interval:  384 QTC Calculation: 432 R Axis:   23 Text Interpretation:  Sinus rhythm Probable anteroseptal infarct, old T wave abnormality Baseline wander Abnormal ekg Confirmed by Carmin Muskrat [phone]) on 03/20/2017 8:50:08 PM       Radiology Patient deferred x-ray   Procedures Procedures (including critical care time)  Medications Ordered in ED Medications  sodium chloride 0.9 % bolus 1,000 mL (1,000 mLs Intravenous Refused 03/20/17 2123)  gi cocktail (Maalox,Lidocaine,Donnatal) (30 mLs Oral Given 03/20/17 2131)  ketorolac (TORADOL) 15 MG/ML injection 15 mg (15 mg Intramuscular Given 03/20/17 2131)     Initial Impression / Assessment and Plan / ED Course  I have reviewed the triage vital signs and the nursing notes.  Pertinent labs & imaging results that were available during my care of the patient were reviewed by me and considered in my medical decision making (see chart for details).   She  deferred fluid bolus, IV medication.  Update: On repeat exam the patient's symptoms have resolved, she has no ongoing complaints.  Vitals unremarkable, labs unremarkable. Given the patient's history of GERD, there is suspicion for gastroesophageal etiology. No evidence for ACS, or other acute new pathology.  On with her resolution of symptoms, following Toradol, GI cocktail, patient started on a course of PPI therapy, discharged in stable condition with primary care follow-up. Final Clinical Impressions(s) / ED Diagnoses   Final diagnoses:  Atypical chest pain    ED Discharge Orders        Ordered    esomeprazole (NEXIUM) 20 MG capsule  Daily     03/20/17 2229       Carmin Muskrat, MD 03/20/17 2239

## 2019-04-24 ENCOUNTER — Other Ambulatory Visit: Payer: Self-pay

## 2019-04-24 DIAGNOSIS — Z20822 Contact with and (suspected) exposure to covid-19: Secondary | ICD-10-CM

## 2019-04-24 NOTE — Addendum Note (Signed)
Addended by: Lucrezia Starch I on: 04/24/2019 10:12 AM   Modules accepted: Orders

## 2019-04-25 LAB — NOVEL CORONAVIRUS, NAA: SARS-CoV-2, NAA: NOT DETECTED

## 2020-05-19 ENCOUNTER — Ambulatory Visit: Payer: Self-pay | Attending: Internal Medicine

## 2020-05-19 DIAGNOSIS — Z23 Encounter for immunization: Secondary | ICD-10-CM

## 2020-05-19 NOTE — Progress Notes (Signed)
   Covid-19 Vaccination Clinic  Name:  Vanessa Gordon    MRN: [id number] DOB: 1960/06/27  05/19/2020  Ms. Vanessa Gordon was observed post Covid-19 immunization for 15 minutes without incident. She was provided with Vaccine Information Sheet and instruction to access the V-Safe system.   Ms. Vanessa Gordon was instructed to call 911 with any severe reactions post vaccine: [name] Kitchen Difficulty breathing  . Swelling of face and throat  . A fast heartbeat  . A bad rash all over body  . Dizziness and weakness   Immunizations Administered    Name Date Dose VIS Date Route   Pfizer COVID-19 Vaccine 05/19/2020  4:45 PM 0.3 mL 03/01/2020 Intramuscular   Manufacturer: [place]   Lot: [id number]   NDC: [address]-0

## 2020-08-23 ENCOUNTER — Emergency Department (HOSPITAL_COMMUNITY): Payer: BC Managed Care – PPO

## 2020-08-23 ENCOUNTER — Telehealth: Payer: Self-pay | Admitting: Pulmonary Disease

## 2020-08-23 ENCOUNTER — Emergency Department (HOSPITAL_COMMUNITY)
Admission: EM | Admit: 2020-08-23 | Discharge: 2020-08-23 | Disposition: A | Discharge status: Home / Self Care | Payer: BC Managed Care – PPO | Attending: Emergency Medicine | Admitting: Emergency Medicine

## 2020-08-23 ENCOUNTER — Other Ambulatory Visit: Payer: Self-pay

## 2020-08-23 DIAGNOSIS — J188 Other pneumonia, unspecified organism: Secondary | ICD-10-CM | POA: Diagnosis not present

## 2020-08-23 DIAGNOSIS — R000 Tachycardia, unspecified: Secondary | ICD-10-CM | POA: Diagnosis not present

## 2020-08-23 DIAGNOSIS — M791 Myalgia, unspecified site: Secondary | ICD-10-CM | POA: Diagnosis not present

## 2020-08-23 DIAGNOSIS — Z7982 Long term (current) use of aspirin: Secondary | ICD-10-CM | POA: Diagnosis not present

## 2020-08-23 DIAGNOSIS — J189 Pneumonia, unspecified organism: Secondary | ICD-10-CM

## 2020-08-23 DIAGNOSIS — Z20822 Contact with and (suspected) exposure to covid-19: Secondary | ICD-10-CM | POA: Insufficient documentation

## 2020-08-23 DIAGNOSIS — R0602 Shortness of breath: Secondary | ICD-10-CM | POA: Diagnosis present

## 2020-08-23 LAB — RESP PANEL BY RT-PCR (FLU A&B, COVID) ARPGX2
Influenza A by PCR: NEGATIVE
Influenza B by PCR: NEGATIVE
SARS Coronavirus 2 by RT PCR: NEGATIVE

## 2020-08-23 LAB — COMPREHENSIVE METABOLIC PANEL
ALT: 47 U/L — ABNORMAL HIGH (ref 0–44)
AST: 58 U/L — ABNORMAL HIGH (ref 15–41)
Albumin: 3.2 g/dL — ABNORMAL LOW (ref 3.5–5.0)
Alkaline Phosphatase: 105 U/L (ref 38–126)
Anion gap: 7 (ref 5–15)
BUN: 15 mg/dL (ref 8–23)
CO2: 25 mmol/L (ref 22–32)
Calcium: 8.8 mg/dL — ABNORMAL LOW (ref 8.9–10.3)
Chloride: 105 mmol/L (ref 98–111)
Creatinine, Ser: 0.94 mg/dL (ref 0.44–1.00)
GFR, Estimated: >60 mL/min (ref >60)
Glucose, Bld: 112 mg/dL — ABNORMAL HIGH (ref 70–99)
Potassium: 4.7 mmol/L (ref 3.5–5.1)
Sodium: 137 mmol/L (ref 135–145)
Total Bilirubin: 0.8 mg/dL (ref 0.3–1.2)
Total Protein: 6.5 g/dL (ref 6.5–8.1)

## 2020-08-23 LAB — CBC WITH DIFFERENTIAL/PLATELET
Abs Immature Granulocytes: 0.06 10*3/uL (ref 0.00–0.07)
Basophils Absolute: 0.1 10*3/uL (ref 0.0–0.1)
Basophils Relative: 1 %
Eosinophils Absolute: 0.2 10*3/uL (ref 0.0–0.5)
Eosinophils Relative: 2 %
HCT: 42.4 % (ref 36.0–46.0)
Hemoglobin: 13.8 g/dL (ref 12.0–15.0)
Immature Granulocytes: 1 %
Lymphocytes Relative: 24 %
Lymphs Abs: 2.5 10*3/uL (ref 0.7–4.0)
MCH: 29.8 pg (ref 26.0–34.0)
MCHC: 32.5 g/dL (ref 30.0–36.0)
MCV: 91.6 fL (ref 80.0–100.0)
Monocytes Absolute: 0.7 10*3/uL (ref 0.1–1.0)
Monocytes Relative: 6 %
Neutro Abs: 6.9 10*3/uL (ref 1.7–7.7)
Neutrophils Relative %: 66 %
Platelets: 456 10*3/uL — ABNORMAL HIGH (ref 150–400)
RBC: 4.63 MIL/uL (ref 3.87–5.11)
RDW: 13.2 % (ref 11.5–15.5)
WBC: 10.4 10*3/uL (ref 4.0–10.5)
nRBC: 0 % (ref 0.0–0.2)

## 2020-08-23 LAB — TROPONIN I (HIGH SENSITIVITY)
Troponin I (High Sensitivity): 7 ng/L (ref <18)
Troponin I (High Sensitivity): 10 ng/L (ref <18)

## 2020-08-23 LAB — D-DIMER, QUANTITATIVE: D-Dimer, Quant: 0.38 ug/mL-FEU (ref 0.00–0.50)

## 2020-08-23 MED ORDER — IOHEXOL 300 MG/ML  SOLN
75.0000 mL | Freq: Once | INTRAMUSCULAR | Status: AC | PRN
  Administered 2020-08-23: 75 mL via INTRAVENOUS

## 2020-08-23 MED ORDER — CEFDINIR 300 MG PO CAPS: 300.0000 mg | ORAL_CAPSULE | Freq: Two times a day (BID) | ORAL | 0 refills | Status: DC

## 2020-08-23 MED ORDER — AZITHROMYCIN 250 MG PO TABS: 500.0000 mg | ORAL_TABLET | Freq: Every day | ORAL | 0 refills | Status: DC

## 2020-08-23 MED ORDER — AZITHROMYCIN 250 MG PO TABS
500.0000 mg | ORAL_TABLET | Freq: Once | ORAL | Status: AC
  Administered 2020-08-23: 500 mg via ORAL
  Filled 2020-08-23: qty 2

## 2020-08-23 MED ORDER — CEFTRIAXONE SODIUM 1 G IJ SOLR
1.0000 g | Freq: Once | INTRAMUSCULAR | Status: AC
  Administered 2020-08-23: 1 g via INTRAVENOUS
  Filled 2020-08-23: qty 10

## 2020-08-23 NOTE — ED Notes (Signed)
Received verbal report from [employer]

## 2020-08-23 NOTE — Telephone Encounter (Signed)
Call patient to have patient seen in the office as soon as possible for close follow-up    Discussed with Dr. Melina Copa  Patient can be discharged home on dual antibiotics Did review the CT showing multifocal infiltrates-patient is clinically stable

## 2020-08-23 NOTE — Discharge Instructions (Addendum)
You were seen in the emergency department for continued cough and shortness of breath. Your Covid testing was negative.  Your CAT scan showed multifocal pneumonia.  Pulmonology is recommending dual antibiotics and follow-up with them in the clinic.  Turn to the emergency department if any worsening or concerning symptoms.

## 2020-08-23 NOTE — ED Notes (Signed)
Family out at desk stating pt needs to go to the restroom

## 2020-08-23 NOTE — ED Triage Notes (Signed)
Pt reports being dx with pneumonia and being prescribed abx, but chest pain and shortness of breath that started one week ago d/t the pna is not improving, but is not getting worse.

## 2020-08-23 NOTE — ED Notes (Signed)
Patient transported to CT 

## 2020-08-23 NOTE — ED Provider Notes (Signed)
[facility] Provider Note   CSN: [id number] Arrival date & time: 08/23/20  1706     History Chief Complaint  Patient presents with  . Pneumonia    Vanessa Gordon is a 60 y.o. female.  She has no significant past medical history.  She is complaining of shortness of breath headache body aches and a nonproductive cough that started over a week ago.  She saw her primary care doctor and had a chest x-ray showing pneumonia and was put on antibiotics.  She is not feeling any improvement despite antibiotics moxifloxacin and saw them today.  They recommended she come to the emergency department for evaluation.  No fevers.  He is having allergy seasonal symptoms.  Some chest pain and burning in her breathing tube from coughing.  No vomiting diarrhea.  She had a home Covid test that was negative.  The history is provided by the patient.  Pneumonia This is a new problem. The current episode started more than 1 week ago. The problem occurs constantly. The problem has not changed since onset.Associated symptoms include chest pain, headaches and shortness of breath. Pertinent negatives include no abdominal pain. The symptoms are aggravated by coughing. Nothing relieves the symptoms. The treatment provided no relief.       No past medical history on file.  There are no problems to display for this patient.   Past Surgical History:  Procedure Laterality Date  . CESAREAN SECTION  1988  . CHOLECYSTECTOMY       OB History   No obstetric history on file.     No family history on file.  Social History   Tobacco Use  . Smoking status: Never Smoker  . Smokeless tobacco: Never Used  Substance Use Topics  . Alcohol use: Yes    Comment: Occasionally   . Drug use: No    Home Medications Prior to Admission medications   Medication Sig Start Date End Date Taking? Authorizing Provider  aspirin EC 81 MG tablet Take 81 mg by mouth every morning.    [provider]  Black Cohosh 40 MG CAPS Take 40 mg by mouth daily.    [provider]  cetirizine (ZYRTEC) 10 MG tablet Take 10 mg by mouth daily.      [provider]  esomeprazole (NEXIUM) 20 MG capsule Take 1 capsule (20 mg total) daily by mouth. 03/20/17   Carmin Muskrat, MD  Fexofenadine-Pseudoephedrine (ALLEGRA-D 12 HOUR PO) Take 1 tablet daily as needed by mouth (allergies).    [provider]  Flaxseed, Linseed, (FLAXSEED OIL) 1200 MG CAPS Take 1,200 mg by mouth daily.    [provider]  folic acid (FOLVITE) 1 MG tablet Take 1 mg by mouth daily.  01/27/16   [provider]  levocetirizine (XYZAL ALLERGY 24HR) 5 MG tablet Take 1 tablet (5 mg total) by mouth every evening. Patient not taking: Reported on 03/20/2017 01/28/16   Ivin Booty, MD  methylPREDNISolone (MEDROL DOSEPAK) 4 MG TBPK tablet Take taper as instructed Patient not taking: Reported on 03/20/2017 01/28/16   Ivin Booty, MD  Multiple Vitamin (MULTIVITAMIN) tablet Take 1 tablet by mouth daily.    [provider]  sulfamethoxazole-trimethoprim (BACTRIM DS,SEPTRA DS) 800-160 MG tablet TK 1 T PO BID FOR 10 DAYS 03/14/17   [provider]    Allergies    Honey bee venom protein [honey bee venom] and Penicillins  Review of Systems   Review of Systems  Constitutional:  Negative for fever.  HENT: Positive for sinus pressure and sneezing. Negative for sore throat.   Eyes: Negative for visual disturbance.  Respiratory: Positive for cough and shortness of breath.   Cardiovascular: Positive for chest pain.  Gastrointestinal: Negative for abdominal pain.  Genitourinary: Negative for dysuria.  Musculoskeletal: Negative for neck pain.  Skin: Negative for rash.  Neurological: Positive for headaches.    Physical Exam Updated Vital Signs BP (!) 167/98 (BP Location: Right Arm)   Pulse (!) 104   Temp 98.7 F (37.1 C) (Oral)   Resp 16   Ht 5\' 8"  (1.727 m)   Wt 78 kg    SpO2 97%   BMI 26.15 kg/m   Physical Exam Vitals and nursing note reviewed.  Constitutional:      General: She is not in acute distress.    Appearance: Normal appearance. She is well-developed.  HENT:     Head: Normocephalic and atraumatic.  Eyes:     Conjunctiva/sclera: Conjunctivae normal.  Cardiovascular:     Rate and Rhythm: Regular rhythm. Tachycardia present.     Pulses: Normal pulses.     Heart sounds: No murmur heard.   Pulmonary:     Effort: Pulmonary effort is normal. No respiratory distress.     Breath sounds: Normal breath sounds.  Abdominal:     Palpations: Abdomen is soft.     Tenderness: There is no abdominal tenderness.  Musculoskeletal:        General: No tenderness. Normal range of motion.     Cervical back: Neck supple.     Right lower leg: No edema.     Left lower leg: No edema.  Skin:    General: Skin is warm and dry.     Capillary Refill: Capillary refill takes less than 2 seconds.  Neurological:     General: No focal deficit present.     Mental Status: She is alert.     ED Results / Procedures / Treatments   Labs (all labs ordered are listed, but only abnormal results are displayed) Labs Reviewed  COMPREHENSIVE METABOLIC PANEL - Abnormal; Notable for the following components:      Result Value   Glucose, Bld 112 (*)    Calcium 8.8 (*)    Albumin 3.2 (*)    AST 58 (*)    ALT 47 (*)    All other components within normal limits  CBC WITH DIFFERENTIAL/PLATELET - Abnormal; Notable for the following components:   Platelets 456 (*)    All other components within normal limits  RESP PANEL BY RT-PCR (FLU A&B, COVID) ARPGX2  D-DIMER, QUANTITATIVE  TROPONIN I (HIGH SENSITIVITY)  TROPONIN I (HIGH SENSITIVITY)    EKG EKG Interpretation  Date/Time:  Wednesday August 23 2020 17:15:37 EDT Ventricular Rate:  104 PR Interval:  139 QRS Duration: 84 QT Interval:  326 QTC Calculation: 429 R Axis:   4 Text Interpretation: Sinus tachycardia Low  voltage, precordial leads Borderline T wave abnormalities Confirmed by Aletta Edouard [phone]) on 08/23/2020 5:32:01 PM   Radiology CT Chest W Contrast  Result Date: 08/23/2020 CLINICAL DATA:  Evaluate for pneumonia. EXAM: CT CHEST WITH CONTRAST TECHNIQUE: Multidetector CT imaging of the chest was performed during intravenous contrast administration. CONTRAST:  30mL OMNIPAQUE IOHEXOL 300 MG/ML  SOLN COMPARISON:  August 03, 2014 FINDINGS: Cardiovascular: No significant vascular findings. Normal heart size. No pericardial effusion. Mediastinum/Nodes: No enlarged mediastinal, hilar, or axillary lymph nodes. Thyroid gland, trachea, and esophagus demonstrate no significant findings. Lungs/Pleura: Bilateral  moderate severity multifocal infiltrates are seen, most prominent within the right middle lobe, left upper lobe and bilateral lower lobes. Mild involvement of the peripheral portions of the bilateral upper lobes is seen. 3 mm and 4 mm noncalcified lung nodules are seen within the bilateral apices. There is no evidence of a pleural effusion or pneumothorax. Upper Abdomen: Multiple surgical clips are seen within the gallbladder fossa. Noninflamed diverticula are seen throughout the large bowel. Musculoskeletal: No chest wall abnormality. No acute or significant osseous findings. IMPRESSION: 1. Bilateral moderate severity multifocal infiltrates, most prominent within the right middle lobe, left upper lobe and bilateral lower lobes. 2. 3 mm and 4 mm noncalcified biapical lung nodules. Non-contrast chest CT at 3-6 months is recommended. If nodules persist and are stable at that time, consider additional non-contrast chest CT examinations at 2 and 4 years. This recommendation follows the consensus statement: Guidelines for Management of Incidental Pulmonary Nodules Detected on CT Images: From the Fleischner Society 2017; Radiology 2017; 284:228-243. 3. Colonic diverticulosis. 4. Evidence of prior cholecystectomy.  Electronically Signed   By: Virgina Norfolk M.D.   On: 08/23/2020 20:30    Procedures Procedures   Medications Ordered in ED Medications  cefTRIAXone (ROCEPHIN) 1 g in sodium chloride 0.9 % 100 mL IVPB (0 g Intravenous Stopped 08/23/20 2110)  azithromycin (ZITHROMAX) tablet 500 mg (500 mg Oral Given 08/23/20 2033)  iohexol (OMNIPAQUE) 300 MG/ML solution 75 mL (75 mLs Intravenous Contrast Given 08/23/20 2025)    ED Course  I have reviewed the triage vital signs and the nursing notes.  Pertinent labs & imaging results that were available during my care of the patient were reviewed by me and considered in my medical decision making (see chart for details).  Clinical Course as of 08/23/20 2256  Wed Aug 23, 2020  1730 Was able to review from a disc that she brought her x-rays from the sixth and the 13th.  She has persistent bibasilar infiltrates. [MB]  2051 Discussed with pulmonary consultant Dr. Gala Murdoch.  He feels she can be treated as an outpatient.  Recommends Omnicef 300 twice daily x7 days and Zithromax 500 x 3 days and pulmonary clinic follow-up. [MB]  2100 Reviewed recommendations from pulmonary with patient.  She is comfortable plan for discharge and outpatient follow-up. [MB]    Clinical Course User Index [MB] Hayden Rasmussen, MD   MDM Rules/Calculators/A&P                         Vanessa Gordon was evaluated in Emergency Department on 08/23/2020 for the symptoms described in the history of present illness. She was evaluated in the context of the global COVID-19 pandemic, which necessitated consideration that the patient might be at risk for infection with the SARS-CoV-2 virus that causes COVID-19. Institutional protocols and algorithms that pertain to the evaluation of patients at risk for COVID-19 are in a state of rapid change based on information released by regulatory bodies including the CDC and federal and state organizations. These policies and algorithms were followed during  the patient's care in the ED.  This patient complains of cough, pneumonia, shortness of breath; this involves an extensive number of treatment Options and is a complaint that carries with it a high risk of complications and Morbidity. The differential includes pneumonia, COVID, bronchitis, PE, pneumothorax, ACS, influenza  I ordered, reviewed and interpreted labs, which included CBC with normal white count normal hemoglobin, chemistries fairly normal other than mild elevations  of AST and ALT, D-dimer negative, Covid and flu negative, troponin flat I ordered medication IV antibiotics I ordered imaging studies which included CT chest with contrast and I independently    visualized and interpreted imaging which showed multifocal pneumonia Additional history obtained from patient significant other Previous records obtained and reviewed in epic including prior PCP visits I consulted pulmonology Dr. Gala Murdoch and discussed lab and imaging findings  Critical Interventions: None  After the interventions stated above, I reevaluated the patient and found patient to be hemodynamically stable.  She is comfortable plan for outpatient treatment and close follow-up with pulmonology.  Return instructions discussed   Final Clinical Impression(s) / ED Diagnoses Final diagnoses:  Multifocal pneumonia    Rx / DC Orders ED Discharge Orders         Ordered    azithromycin (ZITHROMAX) 250 MG tablet  Daily        08/23/20 2108    cefdinir (OMNICEF) 300 MG capsule  2 times daily        08/23/20 2108    Ambulatory referral to Pulmonology       Comments: Multifocal pneumonia   08/23/20 2108           Hayden Rasmussen, MD 08/23/20 2257

## 2020-08-23 NOTE — ED Notes (Signed)
ua sample sent to lab at this time

## 2020-08-24 NOTE — Telephone Encounter (Signed)
Attempted to call pt to see if we can get her scheduled as soon as possible for a HFU appt but unable to reach her. Left pt a detailed message letting her know to call the office to get appt scheduled.

## 2020-09-01 DIAGNOSIS — I4711 Inappropriate sinus tachycardia, so stated: Secondary | ICD-10-CM | POA: Insufficient documentation

## 2020-09-01 DIAGNOSIS — J189 Pneumonia, unspecified organism: Secondary | ICD-10-CM | POA: Insufficient documentation

## 2020-09-01 HISTORY — DX: Pneumonia, unspecified organism: J18.9

## 2020-09-06 ENCOUNTER — Ambulatory Visit: Payer: BC Managed Care – PPO | Admitting: Cardiology

## 2020-09-06 ENCOUNTER — Other Ambulatory Visit: Payer: Self-pay

## 2020-09-06 ENCOUNTER — Encounter: Payer: Self-pay | Admitting: Cardiology

## 2020-09-06 VITALS — BP 141/85 | HR 101 | Temp 98.2°F | Resp 16 | Ht 68.0 in | Wt 169.0 lb

## 2020-09-06 DIAGNOSIS — R002 Palpitations: Secondary | ICD-10-CM | POA: Insufficient documentation

## 2020-09-06 DIAGNOSIS — R000 Tachycardia, unspecified: Secondary | ICD-10-CM | POA: Insufficient documentation

## 2020-09-06 DIAGNOSIS — Z8249 Family history of ischemic heart disease and other diseases of the circulatory system: Secondary | ICD-10-CM | POA: Insufficient documentation

## 2020-09-06 DIAGNOSIS — I471 Supraventricular tachycardia: Secondary | ICD-10-CM | POA: Insufficient documentation

## 2020-09-06 NOTE — Progress Notes (Signed)
Patient referred by Shanon Rosser, PA-C for tachycardia  Subjective:   Vanessa Gordon, female    DOB: 08/18/1960, 60 y.o.   MRN: [id number]   Chief Complaint  Patient presents with  . SVT  . Palpitations  . New Patient (Initial Visit)  . Hospitalization Follow-up     HPI  60 y.o. African American female with tachycardia  Patient was seen in [facility] emergency department on 08/23/2020 with complaints of cough.  Differentials were thought to be pneumonia, COVID, bronchitis, PE, pneumothorax, ACS, influenza.  ACS syndrome PE were excluded with troponin and D-dimer. CT Chest showed multifocal infiltrates. COVID test was negative.   She is slowly recovering from her recent pneumonia. She denies any chest pain, orthopnea, PND, leg edema. She does have exertional dyspnea since her diagnosis of pneumonia. She has noticed increase in heart rate.   Past Medical History:  Diagnosis Date  . Acid reflux      Past Surgical History:  Procedure Laterality Date  . CESAREAN SECTION  1988  . CHOLECYSTECTOMY       Social History   Tobacco Use  Smoking Status Never Smoker  Smokeless Tobacco Never Used    Social History   Substance and Sexual Activity  Alcohol Use Yes   Comment: Occasionally      Family History  Problem Relation Age of Onset  . Heart disease Mother   . Hypertension Mother   . Asthma Mother   . Heart attack Father   . Hypertension Brother   . Renal Disease Brother      Current Outpatient Medications on File Prior to Visit  Medication Sig Dispense Refill  . albuterol (VENTOLIN HFA) 108 (90 Base) MCG/ACT inhaler as needed.    [name] Kitchen aspirin EC 81 MG tablet Take 81 mg by mouth every morning.    [name] Kitchen azelastine (ASTELIN) 0.1 % nasal spray daily at 6 (six) AM.    . Black Cohosh 40 MG CAPS Take 40 mg by mouth daily.    . cetirizine (ZYRTEC) 10 MG tablet Take 10 mg by mouth daily.    [name] Kitchen EPINEPHrine 0.3 mg/0.3 mL IJ SOAJ injection See admin instructions.    .  famotidine (PEPCID) 20 MG tablet Take 1 tablet by mouth daily at 6 (six) AM.    . Fexofenadine-Pseudoephedrine (ALLEGRA-D 12 HOUR PO) Take 1 tablet daily as needed by mouth (allergies).    . Flaxseed, Linseed, (FLAXSEED OIL) 1200 MG CAPS Take 1,200 mg by mouth daily.    . folic acid (FOLVITE) 1 MG tablet Take 1 mg by mouth daily.   11  . Multiple Vitamin (MULTIVITAMIN) tablet Take 1 tablet by mouth daily.     No current facility-administered medications on file prior to visit.    Cardiovascular and other pertinent studies:  EKG 09/06/2020: Sinus rhythm 93 bpm  Borderline left atrial enlargement Nonspecific T-abnormality  EKG 08/23/2020: Sinus tachycardia 104 bpm.   Low voltage precordial leads  CT Chest 08/23/2020: 1. Bilateral moderate severity multifocal infiltrates, most prominent within the right middle lobe, left upper lobe and bilateral lower lobes. 2. 3 mm and 4 mm noncalcified biapical lung nodules. Non-contrast chest CT at 3-6 months is recommended. If nodules persist and are stable at that time, consider additional non-contrast chest CT examinations at 2 and 4 years. This recommendation follows the consensus statement: Guidelines for Management of Incidental Pulmonary Nodules Detected on CT Images: From the Fleischner Society 2017; Radiology 2017; 284:228-243. 3. Colonic diverticulosis. 4. Evidence of prior  cholecystectomy.  Recent labs: 08/23/2020: Glucose 112, BUN/Cr 15/0.94. EGFR >60. Na/K 137/4.7.  H/H 13/42. MCV 91. Platelets 456 HbA1C N/A Lipid panel N/A TSH N/A  Results for HARMONI, LUCUS (MRN [id number]) as of 09/06/2020 09:12  Ref. Range 08/23/2020 17:24 08/23/2020 19:24  Troponin I (High Sensitivity) Latest Ref Range: <18 ng/L 7 10  Results for MURANDA, COYE (MRN [id number]) as of 09/06/2020 09:12  Ref. Range 08/23/2020 17:24 08/23/2020 19:24  Troponin I (High Sensitivity) Latest Ref Range: <18 ng/L 7 10     Review of Systems  Cardiovascular: Positive for  dyspnea on exertion and palpitations. Negative for chest pain, leg swelling and syncope.        Vitals:   09/06/20 1002  BP: (!) 141/85  Pulse: (!) 101  Resp: 16  Temp: 98.2 F (36.8 C)  SpO2: 96%     Body mass index is 25.7 kg/m. Filed Weights   09/06/20 1002  Weight: 169 lb (76.7 kg)     Objective:   Physical Exam Vitals and nursing note reviewed.  Constitutional:      General: She is not in acute distress. Neck:     Vascular: No JVD.  Cardiovascular:     Rate and Rhythm: Normal rate and regular rhythm.     Heart sounds: Normal heart sounds. No murmur heard.   Pulmonary:     Effort: Pulmonary effort is normal.     Breath sounds: Normal breath sounds. No wheezing or rales.  Musculoskeletal:     Right lower leg: No edema.     Left lower leg: No edema.         Assessment & Recommendations:   60 y.o. African American female with tachycardia  Tachycardia, exertional dyspnea: Likely related to recent pneumonia. Will check echocardiogram  Family h/o CAD: Will check screening lipid panel  F/u in 3 months. If HR does not improve, could consider beta blocker.  Thank you for referring the patient to [place]. Please feel free to contact with any questions.   Nigel Mormon, MD Pager: [phone] Office: [phone]

## 2020-09-12 ENCOUNTER — Ambulatory Visit (INDEPENDENT_AMBULATORY_CARE_PROVIDER_SITE_OTHER): Payer: BC Managed Care – PPO | Admitting: Pulmonary Disease

## 2020-09-12 ENCOUNTER — Encounter: Payer: Self-pay | Admitting: Pulmonary Disease

## 2020-09-12 ENCOUNTER — Other Ambulatory Visit: Payer: Self-pay

## 2020-09-12 VITALS — BP 136/80 | HR 101 | Temp 98.1°F | Ht 68.0 in | Wt 168.0 lb

## 2020-09-12 DIAGNOSIS — J189 Pneumonia, unspecified organism: Secondary | ICD-10-CM | POA: Diagnosis not present

## 2020-09-12 DIAGNOSIS — R059 Cough, unspecified: Secondary | ICD-10-CM

## 2020-09-12 DIAGNOSIS — R0602 Shortness of breath: Secondary | ICD-10-CM | POA: Diagnosis not present

## 2020-09-12 MED ORDER — BREO ELLIPTA 200-25 MCG/INH IN AEPB: 1.0000 | INHALATION_SPRAY | Freq: Every day | RESPIRATORY_TRACT | 11 refills | Status: DC

## 2020-09-12 MED ORDER — TRELEGY ELLIPTA 200-62.5-25 MCG/INH IN AEPB: 1.0000 | INHALATION_SPRAY | Freq: Every day | RESPIRATORY_TRACT | 0 refills | Status: DC

## 2020-09-12 MED ORDER — MONTELUKAST SODIUM 10 MG PO TABS
10.0000 mg | ORAL_TABLET | Freq: Every day | ORAL | 11 refills | Status: DC
Start: 2020-09-12 — End: 2021-07-19

## 2020-09-12 NOTE — Patient Instructions (Signed)
Nice to meet you  I suspect the pneumonia is improving over time  To be extra sure, we should repeat a CT scan in the first week of June to make sure there is infiltrates or spots of pneumonia have not gone away.  I worry that some of your symptoms now could be related to your allergies and possible inflammation in your ear tubes similar to asthma.  For nasal congestion, continuing the nasal spray and Zyrtec.  Start taking montelukast 1 pill at night I hope this helps with the congestion.  For the breathing, try Breo 1 puff daily.  This may be quite expensive.  I provided samples of Trelegy (the same medicine but adds an extra medicine to help open up the air tubes and help with breathing) to see if it helps.  If so, it may be worth continuing on with the [facility].  If this inhaler does not help at all, and the [facility] was too expensive, it may not be worth pursuing this medication.  Return to clinic for follow-up in 3 months or sooner as needed with Dr. Silas Flood

## 2020-09-25 ENCOUNTER — Telehealth: Payer: Self-pay | Admitting: Pulmonary Disease

## 2020-09-25 MED ORDER — TRELEGY ELLIPTA 200-62.5-25 MCG/INH IN AEPB: 1.0000 | INHALATION_SPRAY | Freq: Every day | RESPIRATORY_TRACT | 0 refills | Status: DC

## 2020-09-25 NOTE — Telephone Encounter (Signed)
Called and spoke with patient. She was requesting samples of either Trelegy or Breo. I advised her that I could leave 2 samples of Trelegy up front at the front desk for her. She verbalized understanding.   While on the phone, she also stated that she was in need of a refill on her promethazine DM cough syrup. Advised her I would send a message to Dr. Silas Flood.   I reviewed her chart. It does not look anyone from this office has ever prescribed promethazine DM for her. I even searched her outside records and I could not find a prescription.   MH, please advise. Thanks.

## 2020-09-28 NOTE — Telephone Encounter (Signed)
Lmtcb for pt.  

## 2020-09-28 NOTE — Telephone Encounter (Signed)
I called and spoke with patient regarding Dr. Silas Flood recs. She verbalized understanding and will try Delsym per his recs. The Promethizine was filled by PCP so I told patient to reach out to them for a refill. Patient verbalized understanding, nothing further needed

## 2020-10-02 ENCOUNTER — Ambulatory Visit: Payer: BC Managed Care – PPO

## 2020-10-02 ENCOUNTER — Other Ambulatory Visit: Payer: Self-pay

## 2020-10-02 DIAGNOSIS — R000 Tachycardia, unspecified: Secondary | ICD-10-CM

## 2020-10-09 NOTE — Progress Notes (Signed)
@Patient  ID: Vanessa Gordon, female    DOB: November 05, 1960, 60 y.o.   MRN: [id number]  Chief Complaint  Patient presents with  . Hospitalization Follow-up    F/U on PNA. States she has been feeling much better since being home. Denies any new symptoms.     Referring provider: Hayden Rasmussen, MD  HPI:   60 year old whom we are seeing in consultation for evaluation of multiple images on CT scan.  ED note reviewed.  At the time, patient complained about 1 week history of shortness of breath, nonproductive cough.  Associate with body aches.  She had a chest x-ray presented by her PCP: Antibiotics.  Did not improve on moxifloxacin.  Recommended come to the ED.  There she denies any fevers.  Was having seasonal allergies.  Endorse some chest discomfort after coughing.  CT scan performed at that time reviewed and interpreted as bilateral upper lobe groundglass but more dense lower lobe infiltrates.  Was placed on antibiotics and sent home.  Gradually symptoms improved.  She has persistent cough and mild dyspnea exertion.  Worse on inclines or stairs.  No time during things are better or worse.  Cough is nonproductive.  She has worsening allergy symptoms, runny nose, watery eyes over the last few weeks.  PMH: Seasonal allergies Surgical history: Cholecystectomy, hysterectomy Family history: Mother CAD, hypertension, asthma Social History: Never smoker, lives in [place] / Pulmonary Flowsheets:   ACT:  No flowsheet data found.  MMRC: No flowsheet data found.  Epworth:  No flowsheet data found.  Tests:   FENO:  No results found for: NITRICOXIDE  PFT: No flowsheet data found.  WALK:  No flowsheet data found.  Imaging: Personally reviewed and as per EMR discussion this note. PCV ECHOCARDIOGRAM COMPLETE  Result Date: 10/03/2020 Echocardiogram 10/02/2020: Normal LV systolic function with EF 61%. Left ventricle cavity is normal in size. Normal left ventricular  wall thickness. Normal global wall motion. Doppler evidence of grade I (impaired) diastolic dysfunction, normal LAP. Calculated EF 61%. Structurally normal tricuspid valve with trace regurgitation. No evidence of tricuspid valve stenosis. No evidence of pulmonary hypertension. Patient was in sinus tachycardia throughout the study.    Lab Results: Personally reviewed and as per EMR, no anemia CBC    Component Value Date/Time   WBC 10.4 08/23/2020 1724   RBC 4.63 08/23/2020 1724   HGB 13.8 08/23/2020 1724   HCT 42.4 08/23/2020 1724   PLT 456 (H) 08/23/2020 1724   MCV 91.6 08/23/2020 1724   MCH 29.8 08/23/2020 1724   MCHC 32.5 08/23/2020 1724   RDW 13.2 08/23/2020 1724   LYMPHSABS 2.5 08/23/2020 1724   MONOABS 0.7 08/23/2020 1724   EOSABS 0.2 08/23/2020 1724   BASOSABS 0.1 08/23/2020 1724    BMET    Component Value Date/Time   NA 137 08/23/2020 1724   K 4.7 08/23/2020 1724   CL 105 08/23/2020 1724   CO2 25 08/23/2020 1724   GLUCOSE 112 (H) 08/23/2020 1724   BUN 15 08/23/2020 1724   CREATININE 0.94 08/23/2020 1724   CALCIUM 8.8 (L) 08/23/2020 1724   GFRNONAA >60 08/23/2020 1724   GFRAA 59 (L) 03/20/2017 1830    BNP No results found for: BNP  ProBNP No results found for: PROBNP  Specialty Problems   None     Allergies  Allergen Reactions  . Honey Bee Venom Protein [Honey Bee Venom]   . Penicillins Hives    Has patient had a PCN reaction causing  immediate rash, facial/tongue/throat swelling, SOB or lightheadedness with hypotension: Yes Has patient had a PCN reaction causing severe rash involving mucus membranes or skin necrosis: No Has patient had a PCN reaction that required hospitalization No Has patient had a PCN reaction occurring within the last 10 years: No If all of the above answers are "NO", then may proceed with Cephalosporin use.     Immunization History  Administered Date(s) Administered  . PFIZER(Purple Top)SARS-COV-2 Vaccination 05/19/2020     Past Medical History:  Diagnosis Date  . Acid reflux     Tobacco History: Social History   Tobacco Use  Smoking Status Never Smoker  Smokeless Tobacco Never Used   Counseling given: Not Answered   Continue to not smoke  Outpatient Encounter Medications as of 09/12/2020  Medication Sig  . albuterol (VENTOLIN HFA) 108 (90 Base) MCG/ACT inhaler as needed.  [name] Kitchen aspirin EC 81 MG tablet Take 81 mg by mouth every morning.  [name] Kitchen azelastine (ASTELIN) 0.1 % nasal spray daily at 6 (six) AM.  . Black Cohosh 40 MG CAPS Take 40 mg by mouth daily.  . cetirizine (ZYRTEC) 10 MG tablet Take 10 mg by mouth daily.  [name] Kitchen EPINEPHrine 0.3 mg/0.3 mL IJ SOAJ injection See admin instructions.  . famotidine (PEPCID) 20 MG tablet Take 1 tablet by mouth daily at 6 (six) AM.  . Fexofenadine-Pseudoephedrine (ALLEGRA-D 12 HOUR PO) Take 1 tablet daily as needed by mouth (allergies).  . Flaxseed, Linseed, (FLAXSEED OIL) 1200 MG CAPS Take 1,200 mg by mouth daily.  . fluticasone furoate-vilanterol (BREO ELLIPTA) 200-25 MCG/INH AEPB Inhale 1 puff into the lungs daily.  . Fluticasone-Umeclidin-Vilant (TRELEGY ELLIPTA) 200-62.5-25 MCG/INH AEPB Inhale 1 puff into the lungs daily.  . folic acid (FOLVITE) 1 MG tablet Take 1 mg by mouth daily.   . montelukast (SINGULAIR) 10 MG tablet Take 1 tablet (10 mg total) by mouth at bedtime.  . Multiple Vitamin (MULTIVITAMIN) tablet Take 1 tablet by mouth daily.   No facility-administered encounter medications on file as of 09/12/2020.     Review of Systems  Review of Systems  No chest pain with exertion.  No orthopnea or PND.  No lower extremity swelling.  Comprehensive review of systems otherwise negative. Physical Exam  BP 136/80   Pulse (!) 101   Temp 98.1 F (36.7 C) (Temporal)   Ht 5\' 8"  (1.727 m)   Wt 168 lb (76.2 kg)   SpO2 98% Comment: on RA  BMI 25.54 kg/m   Wt Readings from Last 5 Encounters:  09/12/20 168 lb (76.2 kg)  09/06/20 169 lb (76.7 kg)  08/23/20  172 lb (78 kg)  01/28/16 163 lb (73.9 kg)    BMI Readings from Last 5 Encounters:  09/12/20 25.54 kg/m  09/06/20 25.70 kg/m  08/23/20 26.15 kg/m  01/28/16 24.78 kg/m     Physical Exam General: Well-appearing, no acute distress Eyes: EOMI, no icterus Neck: Supple, no JVP appreciated Cardiovascular: Regular rate and rhythm, no murmurs Pulmonary: Clear to auscultation bilaterally, no wheezing, normal work of breathing Abdomen: Nondistended, bowel sounds present MSK: No synovitis, joint effusion Neuro: Normal gait, no weakness Psych: Normal mood, full affect  Assessment & Plan:   Multifocal infiltrates: Likely related to atypical pneumonia. Most symptoms improving with antibiotic therapy. CT chest end 5/22 to make sure improving/resolved.  DOE, nasal congestion: concern for unmasked asthma symptoms. Start Breo.   Nasal allergies: Continue nasal spray, Zyrtec.  Start montelukast.   Return in about 3 months (around 12/13/2020).  Lanier Clam, MD 10/09/2020

## 2020-10-11 ENCOUNTER — Other Ambulatory Visit: Payer: BC Managed Care – PPO

## 2020-10-12 ENCOUNTER — Telehealth: Payer: Self-pay | Admitting: Pulmonary Disease

## 2020-10-12 NOTE — Telephone Encounter (Signed)
Spoke with the pt  She started having cough, congestion and fatigue on 5/29  She started to feel worse by 5/31 and took at home covid test and this was pos  Today her symptoms are increased SOB, minimal wheezing, cough with green sputum and nasal congestion  She is feeling fatigued No fever or aches  She is on trelegy daily and using albuterol occ  She is asking if we can call her in anything  Today would be day 5 since symptoms started  Please advise thanks  Allergies  Allergen Reactions  . Honey Bee Venom Protein [Honey Bee Venom]   . Penicillins Hives    Has patient had a PCN reaction causing immediate rash, facial/tongue/throat swelling, SOB or lightheadedness with hypotension: Yes Has patient had a PCN reaction causing severe rash involving mucus membranes or skin necrosis: No Has patient had a PCN reaction that required hospitalization No Has patient had a PCN reaction occurring within the last 10 years: No If all of the above answers are "NO", then may proceed with Cephalosporin use.

## 2020-10-12 NOTE — Telephone Encounter (Signed)
Agreed. Would start course of paxlovid.  Garner Nash, DO [facility] 10/12/2020 12:59 PM

## 2020-10-13 MED ORDER — NIRMATRELVIR/RITONAVIR (PAXLOVID) TABLET (RENAL DOSING): 2.0000 | ORAL_TABLET | Freq: Two times a day (BID) | ORAL | 0 refills | Status: AC

## 2020-10-13 NOTE — Telephone Encounter (Signed)
Called and spoke with patient, advised of recommendations per Dr. Valeta Harms.  Script sent to pharmacy for paxlovid after verifying pharmacy with patient.  Nothing further needed.

## 2020-10-23 ENCOUNTER — Ambulatory Visit (INDEPENDENT_AMBULATORY_CARE_PROVIDER_SITE_OTHER)
Admission: RE | Admit: 2020-10-23 | Discharge: 2020-10-23 | Disposition: A | Discharge status: Home / Self Care | Payer: BC Managed Care – PPO | Source: Ambulatory Visit | Attending: Pulmonary Disease | Admitting: Pulmonary Disease

## 2020-10-23 ENCOUNTER — Other Ambulatory Visit: Payer: Self-pay

## 2020-10-23 DIAGNOSIS — J189 Pneumonia, unspecified organism: Secondary | ICD-10-CM

## 2020-10-25 ENCOUNTER — Other Ambulatory Visit: Payer: Self-pay

## 2020-10-25 ENCOUNTER — Telehealth: Payer: Self-pay | Admitting: Pulmonary Disease

## 2020-10-25 MED ORDER — TRELEGY ELLIPTA 200-62.5-25 MCG/INH IN AEPB: 1.0000 | INHALATION_SPRAY | Freq: Every day | RESPIRATORY_TRACT | 0 refills | Status: DC

## 2020-10-25 NOTE — Progress Notes (Signed)
CT chest shows improved areas of pneumonia compared to 08/2020. However, appears some scarring related to prior infection has occurred.

## 2020-10-25 NOTE — Telephone Encounter (Signed)
I called and spoke with pt and she stated that she is almost out of the trelegy samples that she was given before.  I asked her about the pt assistance and she stated that she has insurance but it is the high deductible plan and that is why her inhalers are about $300 per month.  She stated that she cannot afford that but all of the inhalers are that much with the high deductible plan.    I have left 2 samples up front for the pt and she is aware.  Will forward to [place] to make him aware.

## 2020-11-29 LAB — LIPID PANEL
Chol/HDL Ratio: 3.1 ratio (ref 0.0–4.4)
Cholesterol, Total: 171 mg/dL (ref 100–199)
HDL: 56 mg/dL (ref >39)
LDL Chol Calc (NIH): 98 mg/dL (ref 0–99)
Triglycerides: 92 mg/dL (ref 0–149)
VLDL Cholesterol Cal: 17 mg/dL (ref 5–40)

## 2020-12-06 ENCOUNTER — Encounter: Payer: Self-pay | Admitting: Cardiology

## 2020-12-06 ENCOUNTER — Ambulatory Visit: Payer: BC Managed Care – PPO | Admitting: Cardiology

## 2020-12-06 ENCOUNTER — Other Ambulatory Visit: Payer: Self-pay

## 2020-12-06 VITALS — BP 163/97 | HR 103 | Temp 98.0°F | Resp 16 | Ht 68.0 in | Wt 165.0 lb

## 2020-12-06 DIAGNOSIS — R000 Tachycardia, unspecified: Secondary | ICD-10-CM

## 2020-12-06 DIAGNOSIS — R002 Palpitations: Secondary | ICD-10-CM

## 2020-12-06 NOTE — Progress Notes (Signed)
Patient referred by Shanon Rosser, PA-C for tachycardia  Subjective:   Vanessa Gordon, female    DOB: 1961/03/29, 60 y.o.   MRN: [id number]   Chief Complaint  Patient presents with   Palpitations   Tachycardia   Follow-up    3 month    HPI  60 y.o. African American female presents for follow up of tachycardia   Patients tachycardia has historically improved. Her Apple watch shows HR 54-115 bpm. However, patient does not feel well today. She has had dry cough for last couple days, along with retrosternal burning sensation. She denies any chest pain. She did a home COVID test two days ago, which was negative.   Current Outpatient Medications on File Prior to Visit  Medication Sig Dispense Refill   albuterol (VENTOLIN HFA) 108 (90 Base) MCG/ACT inhaler as needed.     aspirin EC 81 MG tablet Take 81 mg by mouth every morning.     azelastine (ASTELIN) 0.1 % nasal spray daily at 6 (six) AM.     Black Cohosh 40 MG CAPS Take 40 mg by mouth daily.     cetirizine (ZYRTEC) 10 MG tablet Take 10 mg by mouth daily.     Docusate Sodium (STOOL SOFTENER LAXATIVE PO) Take by mouth.     EPINEPHrine 0.3 mg/0.3 mL IJ SOAJ injection See admin instructions.     famotidine (PEPCID) 20 MG tablet Take 1 tablet by mouth daily at 6 (six) AM.     Fexofenadine-Pseudoephedrine (ALLEGRA-D 12 HOUR PO) Take 1 tablet daily as needed by mouth (allergies).     fluticasone furoate-vilanterol (BREO ELLIPTA) 200-25 MCG/INH AEPB Inhale 1 puff into the lungs daily. 60 each 11   folic acid (FOLVITE) 1 MG tablet Take 1 mg by mouth daily.   11   montelukast (SINGULAIR) 10 MG tablet Take 1 tablet (10 mg total) by mouth at bedtime. 30 tablet 11   Multiple Vitamin (MULTIVITAMIN) tablet Take 1 tablet by mouth daily.     multivitamin-lutein (OCUVITE-LUTEIN) CAPS capsule Take 1 capsule by mouth daily.     No current facility-administered medications on file prior to visit.    Cardiovascular and other pertinent  studies:  EKG on 12/06/2020: Sinus tachycardia with a rate of 118. Left axis deviation. No evidence of ischemia or injury.   CT Chest 10/23/2020: Cardiovascular: No significant vascular findings. Normal heart size. No pericardial effusion. Persistent multifocal areas of consolidation bilaterally with associated bronchiectasis. Decreasing density of the infiltrates with similar pattern of involvement. Changes may represent residual pneumonia or developing scarring from previous infection. No new consolidation.  Echocardiogram 10/02/2020:  Normal LV systolic function with EF 61%. Left ventricle cavity is normal  in size. Normal left ventricular wall thickness. Normal global wall  motion. Doppler evidence of grade I (impaired) diastolic dysfunction,  normal LAP. Calculated EF 61%.  Structurally normal tricuspid valve with trace regurgitation. No evidence  of tricuspid valve stenosis. No evidence of pulmonary hypertension.  Patient was in sinus tachycardia throughout the study.  EKG 09/06/2020: Sinus rhythm 93 bpm  Borderline left atrial enlargement Nonspecific T-abnormality  EKG 08/23/2020: Sinus tachycardia 104 bpm.   Low voltage precordial leads  CT Chest 08/23/2020: 1. Bilateral moderate severity multifocal infiltrates, most prominent within the right middle lobe, left upper lobe and bilateral lower lobes. 2. 3 mm and 4 mm noncalcified biapical lung nodules. Non-contrast chest CT at 3-6 months is recommended. If nodules persist and are stable at that time, consider additional non-contrast chest CT  examinations at 2 and 4 years. This recommendation follows the consensus statement: Guidelines for Management of Incidental Pulmonary Nodules Detected on CT Images: From the Fleischner Society 2017; Radiology 2017; 284:228-243. 3. Colonic diverticulosis. 4. Evidence of prior cholecystectomy.  Recent labs: 11/28/2020:  LDL 98, HDL 56, Triglycerides 92, Total Cholesterol  171  08/23/2020: Glucose 112, BUN/Cr 15/0.94. EGFR >60. Na/K 137/4.7.  H/H 13/42. MCV 91. Platelets 456 HbA1C N/A Lipid panel N/A TSH N/A  Results for LENOX, LADOUCEUR (MRN [id number]) as of 09/06/2020 09:12  Ref. Range 08/23/2020 17:24 08/23/2020 19:24  Troponin I (High Sensitivity) Latest Ref Range: <18 ng/L 7 10  Results for DEONDREA, AGUADO (MRN [id number]) as of 09/06/2020 09:12  Ref. Range 08/23/2020 17:24 08/23/2020 19:24  Troponin I (High Sensitivity) Latest Ref Range: <18 ng/L 7 10     Review of Systems  Constitutional: Positive for malaise/fatigue. Negative for fever.  HENT:  Positive for congestion.        Positive for post nasal drip  Cardiovascular:  Negative for chest pain, dyspnea on exertion, leg swelling, near-syncope, palpitations and syncope.  Respiratory:  Positive for cough (dry). Negative for shortness of breath and sleep disturbances due to breathing.   Neurological:  Positive for dizziness.       Vitals:   12/06/20 1359  BP: (!) 163/97  Pulse: (!) 103  Resp: 16  Temp: 98 F (36.7 C)  SpO2: 98%     Body mass index is 25.09 kg/m. Filed Weights   12/06/20 1359  Weight: 74.8 kg    Objective:   Physical Exam Vitals reviewed.  Constitutional:      General: She is not in acute distress.    Appearance: Normal appearance.  Neck:     Vascular: No carotid bruit or JVD.  Cardiovascular:     Rate and Rhythm: Regular rhythm. Tachycardia present.     Pulses: Normal pulses.     Heart sounds: Normal heart sounds. No murmur heard.   No gallop.  Pulmonary:     Effort: Pulmonary effort is normal. No respiratory distress.     Breath sounds: Normal breath sounds. No wheezing or rales.  Musculoskeletal:     Right lower leg: No edema.     Left lower leg: No edema.  Neurological:     Mental Status: She is alert.      Assessment & Recommendations:   60 y/o African-American female with tachycardia  Sinus tachycardia: Improvement in baseline sinus tachycardia  prior to today's visit.  Suspect tachycardia present today is related to possible URI/COVID infection. EKG with no ischemia at sinus tachycardia 118 bpm. She does not have any hypoxia at rest.  I recommended to get tested for COVID.  I also instructed her to present to emergency room should she have severe dyspnea or hypoxia.  We will reevaluate her heart rate and blood pressure at follow-up visit in 4 weeks.  Hypertension: Blood pressure elevated today.  Recommend home monitoring, low-salt diet.  Will readdress blood pressure at follow-up visit in 4 weeks.   F/u in 4 weeks   [place], [place] Office: [phone]

## 2021-01-03 ENCOUNTER — Other Ambulatory Visit: Payer: Self-pay

## 2021-01-03 ENCOUNTER — Encounter: Payer: Self-pay | Admitting: Cardiology

## 2021-01-03 ENCOUNTER — Ambulatory Visit: Payer: BC Managed Care – PPO | Admitting: Cardiology

## 2021-01-03 VITALS — BP 149/83 | HR 107 | Temp 98.0°F | Resp 17 | Ht 67.0 in | Wt 166.0 lb

## 2021-01-03 DIAGNOSIS — R000 Tachycardia, unspecified: Secondary | ICD-10-CM

## 2021-01-03 NOTE — Progress Notes (Signed)
Patient referred by Vanessa Rosser, PA-C for tachycardia  Subjective:   Vanessa Gordon, female    DOB: [birth year], 60 y.o.   MRN: [id number]   Chief Complaint  Patient presents with   Tachycardia   Follow-up    4 week    HPI  60 y.o. African American female presents for follow up of tachycardia   Heart rate remains elevated at office visit. However, I personally reviewed her smart watch data, with heart rates 50s to 110s, blood pressures also controlled at home. Patient has no complaints today.   Current Outpatient Medications on File Prior to Visit  Medication Sig Dispense Refill   albuterol (VENTOLIN HFA) 108 (90 Base) MCG/ACT inhaler as needed.     aspirin EC 81 MG tablet Take 81 mg by mouth every morning.     azelastine (ASTELIN) 0.1 % nasal spray daily at 6 (six) AM.     Black Cohosh 40 MG CAPS Take 40 mg by mouth daily.     cetirizine (ZYRTEC) 10 MG tablet Take 10 mg by mouth daily.     Docusate Sodium (STOOL SOFTENER LAXATIVE PO) Take by mouth.     EPINEPHrine 0.3 mg/0.3 mL IJ SOAJ injection See admin instructions.     famotidine (PEPCID) 20 MG tablet Take 1 tablet by mouth daily at 6 (six) AM.     Fexofenadine-Pseudoephedrine (ALLEGRA-D 12 HOUR PO) Take 1 tablet daily as needed by mouth (allergies).     fluticasone furoate-vilanterol (BREO ELLIPTA) 200-25 MCG/INH AEPB Inhale 1 puff into the lungs daily. 60 each 11   folic acid (FOLVITE) 1 MG tablet Take 1 mg by mouth daily.   11   montelukast (SINGULAIR) 10 MG tablet Take 1 tablet (10 mg total) by mouth at bedtime. 30 tablet 11   Multiple Vitamin (MULTIVITAMIN) tablet Take 1 tablet by mouth daily.     multivitamin-lutein (OCUVITE-LUTEIN) CAPS capsule Take 1 capsule by mouth daily.     No current facility-administered medications on file prior to visit.    Cardiovascular and other pertinent studies:  EKG on 12/06/2020: Sinus tachycardia with a rate of 118. Left axis deviation. No evidence of ischemia or injury.    CT Chest 10/23/2020: Cardiovascular: No significant vascular findings. Normal heart size. No pericardial effusion. Persistent multifocal areas of consolidation bilaterally with associated bronchiectasis. Decreasing density of the infiltrates with similar pattern of involvement. Changes may represent residual pneumonia or developing scarring from previous infection. No new consolidation.  Echocardiogram 10/02/2020:  Normal LV systolic function with EF 61%. Left ventricle cavity is normal  in size. Normal left ventricular wall thickness. Normal global wall  motion. Doppler evidence of grade I (impaired) diastolic dysfunction,  normal LAP. Calculated EF 61%.  Structurally normal tricuspid valve with trace regurgitation. No evidence  of tricuspid valve stenosis. No evidence of pulmonary hypertension.  Patient was in sinus tachycardia throughout the study.  EKG 09/06/2020: Sinus rhythm 93 bpm  Borderline left atrial enlargement Nonspecific T-abnormality  EKG 08/23/2020: Sinus tachycardia 104 bpm.   Low voltage precordial leads  CT Chest 08/23/2020: 1. Bilateral moderate severity multifocal infiltrates, most prominent within the right middle lobe, left upper lobe and bilateral lower lobes. 2. 3 mm and 4 mm noncalcified biapical lung nodules. Non-contrast chest CT at 3-6 months is recommended. If nodules persist and are stable at that time, consider additional non-contrast chest CT examinations at 2 and 4 years. This recommendation follows the consensus statement: Guidelines for Management of Incidental Pulmonary Nodules Detected on  CT Images: From the Fleischner Society 2017; Radiology 2017; [id number]. 3. Colonic diverticulosis. 4. Evidence of prior cholecystectomy.  Recent labs: 11/28/2020:  Chol 171, TG 92, HDL 56, LDL 98  08/23/2020: Glucose 112, BUN/Cr 15/0.94. EGFR >60. Na/K 137/4.7.  H/H 13/42. MCV 91. Platelets 456 HbA1C N/A Lipid panel N/A TSH N/A  Results for  Vanessa Gordon (MRN [id number]) as of 09/06/2020 09:12  Ref. Range 08/23/2020 17:24 08/23/2020 19:24  Troponin I (High Sensitivity) Latest Ref Range: <18 ng/L 7 10  Results for Vanessa Gordon (MRN [id number]) as of 09/06/2020 09:12  Ref. Range 08/23/2020 17:24 08/23/2020 19:24  Troponin I (High Sensitivity) Latest Ref Range: <18 ng/L 7 10     Review of Systems  Constitutional: Positive for malaise/fatigue. Negative for fever.  HENT:  Positive for congestion.        Positive for post nasal drip  Cardiovascular:  Negative for chest pain, dyspnea on exertion, leg swelling, near-syncope, palpitations and syncope.  Respiratory:  Positive for cough (dry). Negative for shortness of breath and sleep disturbances due to breathing.   Neurological:  Positive for dizziness.       Vitals:   01/03/21 1410 01/03/21 1411  BP: (!) 151/88 (!) 149/83  Pulse: (!) 105 (!) 107  Resp: 17   Temp: 98 F (36.7 C)   SpO2: 97%      Body mass index is 26 kg/m. Filed Weights   01/03/21 1410  Weight: 166 lb (75.3 kg)    Objective:   Physical Exam Vitals reviewed.  Constitutional:      General: She is not in acute distress.    Appearance: Normal appearance.  Neck:     Vascular: No carotid bruit or JVD.  Cardiovascular:     Rate and Rhythm: Regular rhythm. Tachycardia present.     Pulses: Normal pulses.     Heart sounds: Normal heart sounds. No murmur heard.   No gallop.  Pulmonary:     Effort: Pulmonary effort is normal. No respiratory distress.     Breath sounds: Normal breath sounds. No wheezing or rales.  Musculoskeletal:     Right lower leg: No edema.     Left lower leg: No edema.  Neurological:     Mental Status: She is alert.      Assessment & Recommendations:   60 y/o African-American female with tachycardia  Sinus tachycardia, hypertension: Given normal numbers at baseline, I suspect there is a component of white coat anxiety, hypertension. Will check TSH.  F/u as needed   Vanessa  Lackey, PA-S Office: [phone]

## 2021-02-16 ENCOUNTER — Other Ambulatory Visit (HOSPITAL_COMMUNITY): Payer: Self-pay | Admitting: Cardiology

## 2021-02-16 DIAGNOSIS — R000 Tachycardia, unspecified: Secondary | ICD-10-CM | POA: Diagnosis not present

## 2021-02-17 LAB — TSH: TSH: 1.01 u[IU]/mL (ref 0.450–4.500)

## 2021-02-23 DIAGNOSIS — H1033 Unspecified acute conjunctivitis, bilateral: Secondary | ICD-10-CM | POA: Diagnosis not present

## 2021-02-23 DIAGNOSIS — J019 Acute sinusitis, unspecified: Secondary | ICD-10-CM | POA: Diagnosis not present

## 2021-04-11 ENCOUNTER — Other Ambulatory Visit: Payer: Self-pay

## 2021-04-11 ENCOUNTER — Ambulatory Visit (INDEPENDENT_AMBULATORY_CARE_PROVIDER_SITE_OTHER): Payer: BC Managed Care – PPO | Admitting: Podiatry

## 2021-04-11 DIAGNOSIS — L600 Ingrowing nail: Secondary | ICD-10-CM

## 2021-04-13 ENCOUNTER — Encounter: Payer: Self-pay | Admitting: Podiatry

## 2021-04-13 ENCOUNTER — Encounter: Payer: Self-pay | Admitting: Pulmonary Disease

## 2021-04-13 NOTE — Progress Notes (Signed)
Subjective:  Patient ID: Vanessa Gordon, female    DOB: May 05, 1961,  MRN: [id number]  Chief Complaint  Patient presents with   Ingrown Toenail    BILATERAL     60 y.o. female presents with the above complaint.  Patient presents with complaint bilateral medial border ingrown.  Patient is a painful to touch painful to walk on.  She has tried some self debridement none of which has helped.  She would like to have removed.  She has not seen anyone else prior to seeing me.  She denies any other acute complaints.   Review of Systems: Negative except as noted in the HPI. Denies N/V/F/Ch.  Past Medical History:  Diagnosis Date   Acid reflux     Current Outpatient Medications:    albuterol (VENTOLIN HFA) 108 (90 Base) MCG/ACT inhaler, as needed., Disp: , Rfl:    aspirin EC 81 MG tablet, Take 81 mg by mouth every morning., Disp: , Rfl:    azelastine (ASTELIN) 0.1 % nasal spray, daily at 6 (six) AM., Disp: , Rfl:    Black Cohosh 40 MG CAPS, Take 40 mg by mouth daily., Disp: , Rfl:    cetirizine (ZYRTEC) 10 MG tablet, Take 10 mg by mouth daily., Disp: , Rfl:    Docusate Sodium (STOOL SOFTENER LAXATIVE PO), Take by mouth., Disp: , Rfl:    EPINEPHrine 0.3 mg/0.3 mL IJ SOAJ injection, See admin instructions., Disp: , Rfl:    famotidine (PEPCID) 20 MG tablet, Take 1 tablet by mouth daily at 6 (six) AM., Disp: , Rfl:    Fexofenadine-Pseudoephedrine (ALLEGRA-D 12 HOUR PO), Take 1 tablet daily as needed by mouth (allergies)., Disp: , Rfl:    fluticasone furoate-vilanterol (BREO ELLIPTA) 200-25 MCG/INH AEPB, Inhale 1 puff into the lungs daily., Disp: 60 each, Rfl: 11   folic acid (FOLVITE) 1 MG tablet, Take 1 mg by mouth daily. , Disp: , Rfl: 11   montelukast (SINGULAIR) 10 MG tablet, Take 1 tablet (10 mg total) by mouth at bedtime., Disp: 30 tablet, Rfl: 11   Multiple Vitamin (MULTIVITAMIN) tablet, Take 1 tablet by mouth daily., Disp: , Rfl:    multivitamin-lutein (OCUVITE-LUTEIN) CAPS capsule,  Take 1 capsule by mouth daily., Disp: , Rfl:   Social History   Tobacco Use  Smoking Status Never  Smokeless Tobacco Never    Allergies  Allergen Reactions   Honey Bee Venom Protein [Honey Bee Venom]    Penicillins Hives    Has patient had a PCN reaction causing immediate rash, facial/tongue/throat swelling, SOB or lightheadedness with hypotension: Yes Has patient had a PCN reaction causing severe rash involving mucus membranes or skin necrosis: No Has patient had a PCN reaction that required hospitalization No Has patient had a PCN reaction occurring within the last 10 years: No If all of the above answers are "NO", then may proceed with Cephalosporin use.    Objective:  There were no vitals filed for this visit. There is no height or weight on file to calculate BMI. Constitutional Well developed. Well nourished.  Vascular Dorsalis pedis pulses palpable bilaterally. Posterior tibial pulses palpable bilaterally. Capillary refill normal to all digits.  No cyanosis or clubbing noted. Pedal hair growth normal.  Neurologic Normal speech. Oriented to person, place, and time. Epicritic sensation to light touch grossly present bilaterally.  Dermatologic Painful ingrowing nail at medial nail borders of the hallux nail bilaterally. No other open wounds. No skin lesions.  Orthopedic: Normal joint ROM without pain or crepitus bilaterally. No  visible deformities. No bony tenderness.   Radiographs: None Assessment:   1. Ingrown toenail of right foot   2. Ingrown left big toenail    Plan:  Patient was evaluated and treated and all questions answered.  Ingrown Nail, bilaterally -Patient elects to proceed with minor surgery to remove ingrown toenail removal today. Consent reviewed and signed by patient. -Ingrown nail excised. See procedure note. -Educated on post-procedure care including soaking. Written instructions provided and reviewed. -Patient to follow up in 2 weeks for  nail check.  Procedure: Excision of Ingrown Toenail Location: Bilateral 1st toe medial nail borders. Anesthesia: Lidocaine 1% plain; 1.5 mL and Marcaine 0.5% plain; 1.5 mL, digital block. Skin Prep: Betadine. Dressing: Silvadene; telfa; dry, sterile, compression dressing. Technique: Following skin prep, the toe was exsanguinated and a tourniquet was secured at the base of the toe. The affected nail border was freed, split with a nail splitter, and excised. Chemical matrixectomy was then performed with phenol and irrigated out with alcohol. The tourniquet was then removed and sterile dressing applied. Disposition: Patient tolerated procedure well. Patient to return in 2 weeks for follow-up.   No follow-ups on file.

## 2021-04-20 ENCOUNTER — Encounter: Payer: Self-pay | Admitting: Pulmonary Disease

## 2021-04-20 ENCOUNTER — Ambulatory Visit (INDEPENDENT_AMBULATORY_CARE_PROVIDER_SITE_OTHER): Payer: BC Managed Care – PPO | Admitting: Pulmonary Disease

## 2021-04-20 ENCOUNTER — Other Ambulatory Visit: Payer: Self-pay

## 2021-04-20 VITALS — BP 138/86 | HR 79 | Temp 98.5°F | Ht 68.0 in | Wt 170.0 lb

## 2021-04-20 DIAGNOSIS — J302 Other seasonal allergic rhinitis: Secondary | ICD-10-CM

## 2021-04-20 DIAGNOSIS — J4540 Moderate persistent asthma, uncomplicated: Secondary | ICD-10-CM | POA: Diagnosis not present

## 2021-04-20 MED ORDER — TRELEGY ELLIPTA 200-62.5-25 MCG/ACT IN AEPB: 1.0000 | INHALATION_SPRAY | Freq: Every day | RESPIRATORY_TRACT | 3 refills | Status: DC

## 2021-04-20 NOTE — Patient Instructions (Addendum)
Nice to see you again  For the next 7 days, use Flonase 2 sprays each nostril at night, after 7 days go back to 1 spray each nostril at night.  Continue your azelastine spray.  Continue Zyrtec.  Continue montelukast.  Add Afrin 2 sprays each nostril twice a day for 3 days then stop.  Use Trelegy 1 puff daily.  Sent a new prescription.  Also provided samples.  We may need to do paperwork in the new year to help with the cost, sometimes we can have the manufacturer pay for part of the drug.  Please contact us in early January to follow-up on this.  Return to clinic in 3 months or sooner as needed with Dr. Silas Flood

## 2021-04-23 NOTE — Progress Notes (Signed)
@Patient  ID: Vanessa Gordon, female    DOB: 04-02-1961, 60 y.o.   MRN: [id number]  Chief Complaint  Patient presents with   Follow-up    Pt states that her cough is doing much better. Pt states she is having some drainage right now due to the weather change     Referring provider: Shanon Rosser, PA-C  HPI:   60 y.o. whom we are seeing in follow up for evaluation of abnormality on CT scan.  Cardiology note x 2 reviewed.  Doing ok. Found trelegy helpful. But too expensive. Been without. Symptoms worse. Also with a lot of nasal congestion. Endorses post nasal drip. Cough a bit worse with this. Reviewed CT chest done 10/2020 with patient. This on my review and interpretation shows improved infiltrates and signs of developing injury.   HPI at initial visit: At the time, patient complained about 1 week history of shortness of breath, nonproductive cough.  Associate with body aches.  She had a chest x-ray presented by her PCP: Antibiotics.  Did not improve on moxifloxacin.  Recommended come to the ED.  There she denies any fevers.  Was having seasonal allergies.  Endorse some chest discomfort after coughing.  CT scan performed at that time reviewed and interpreted as bilateral upper lobe groundglass but more dense lower lobe infiltrates.  Was placed on antibiotics and sent home.  Gradually symptoms improved.  She has persistent cough and mild dyspnea exertion.  Worse on inclines or stairs.  No time during things are better or worse.  Cough is nonproductive.  She has worsening allergy symptoms, runny nose, watery eyes over the last few weeks.  PMH: Seasonal allergies Surgical history: Cholecystectomy, hysterectomy Family history: Mother CAD, hypertension, asthma Social History: Never smoker, lives in [place] / Pulmonary Flowsheets:   ACT:  No flowsheet data found.  MMRC: No flowsheet data found.  Epworth:  No flowsheet data found.  Tests:   FENO:  No results found  for: NITRICOXIDE  PFT: No flowsheet data found.  WALK:  No flowsheet data found.  Imaging: Personally reviewed and as per EMR discussion this note. No results found.   Lab Results: Personally reviewed and as per EMR, no anemia CBC    Component Value Date/Time   WBC 10.4 08/23/2020 1724   RBC 4.63 08/23/2020 1724   HGB 13.8 08/23/2020 1724   HCT 42.4 08/23/2020 1724   PLT 456 (H) 08/23/2020 1724   MCV 91.6 08/23/2020 1724   MCH 29.8 08/23/2020 1724   MCHC 32.5 08/23/2020 1724   RDW 13.2 08/23/2020 1724   LYMPHSABS 2.5 08/23/2020 1724   MONOABS 0.7 08/23/2020 1724   EOSABS 0.2 08/23/2020 1724   BASOSABS 0.1 08/23/2020 1724    BMET    Component Value Date/Time   NA 137 08/23/2020 1724   K 4.7 08/23/2020 1724   CL 105 08/23/2020 1724   CO2 25 08/23/2020 1724   GLUCOSE 112 (H) 08/23/2020 1724   BUN 15 08/23/2020 1724   CREATININE 0.94 08/23/2020 1724   CALCIUM 8.8 (L) 08/23/2020 1724   GFRNONAA >60 08/23/2020 1724   GFRAA 59 (L) 03/20/2017 1830    BNP No results found for: BNP  ProBNP No results found for: PROBNP  Specialty Problems   None  Allergies  Allergen Reactions   Honey Bee Venom Protein [Honey Bee Venom]    Penicillins Hives    Has patient had a PCN reaction causing immediate rash, facial/tongue/throat swelling, SOB or lightheadedness with hypotension:  Yes Has patient had a PCN reaction causing severe rash involving mucus membranes or skin necrosis: No Has patient had a PCN reaction that required hospitalization No Has patient had a PCN reaction occurring within the last 10 years: No If all of the above answers are "NO", then may proceed with Cephalosporin use.     Immunization History  Administered Date(s) Administered   PFIZER(Purple Top)SARS-COV-2 Vaccination 05/19/2020    Past Medical History:  Diagnosis Date   Acid reflux     Tobacco History: Social History   Tobacco Use  Smoking Status Never  Smokeless Tobacco Never    Counseling given: Not Answered   Continue to not smoke  Outpatient Encounter Medications as of 04/20/2021  Medication Sig   albuterol (VENTOLIN HFA) 108 (90 Base) MCG/ACT inhaler as needed.   azelastine (ASTELIN) 0.1 % nasal spray daily at 6 (six) AM.   Black Cohosh 40 MG CAPS Take 40 mg by mouth daily.   cetirizine (ZYRTEC) 10 MG tablet Take 10 mg by mouth daily.   Docusate Sodium (STOOL SOFTENER LAXATIVE PO) Take by mouth.   EPINEPHrine 0.3 mg/0.3 mL IJ SOAJ injection See admin instructions.   famotidine (PEPCID) 20 MG tablet Take 1 tablet by mouth daily at 6 (six) AM.   Fexofenadine-Pseudoephedrine (ALLEGRA-D 12 HOUR PO) Take 1 tablet daily as needed by mouth (allergies).   fluticasone furoate-vilanterol (BREO ELLIPTA) 200-25 MCG/INH AEPB Inhale 1 puff into the lungs daily.   Fluticasone-Umeclidin-Vilant (TRELEGY ELLIPTA) 200-62.5-25 MCG/ACT AEPB Inhale 1 puff into the lungs daily.   folic acid (FOLVITE) 1 MG tablet Take 1 mg by mouth daily.    montelukast (SINGULAIR) 10 MG tablet Take 1 tablet (10 mg total) by mouth at bedtime.   Multiple Vitamin (MULTIVITAMIN) tablet Take 1 tablet by mouth daily.   multivitamin-lutein (OCUVITE-LUTEIN) CAPS capsule Take 1 capsule by mouth daily.   aspirin EC 81 MG tablet Take 81 mg by mouth every morning.   No facility-administered encounter medications on file as of 04/20/2021.     Review of Systems  Review of Systems  N/a Physical Exam  BP 138/86 (BP Location: Left Arm, Patient Position: Sitting, Cuff Size: Normal)   Pulse 79   Temp 98.5 F (36.9 C) (Oral)   Ht 5\' 8"  (1.727 m)   Wt 170 lb (77.1 kg)   SpO2 98%   BMI 25.85 kg/m   Wt Readings from Last 5 Encounters:  04/20/21 170 lb (77.1 kg)  01/03/21 166 lb (75.3 kg)  12/06/20 165 lb (74.8 kg)  09/12/20 168 lb (76.2 kg)  09/06/20 169 lb (76.7 kg)    BMI Readings from Last 5 Encounters:  04/20/21 25.85 kg/m  01/03/21 26.00 kg/m  12/06/20 25.09 kg/m  09/12/20 25.54 kg/m   09/06/20 25.70 kg/m     Physical Exam General: Well-appearing, no acute distress Eyes: EOMI, no icterus Neck: Supple, no JVP appreciated Cardiovascular: Regular rate and rhythm, no murmurs Pulmonary: Clear to auscultation bilaterally, no wheezing, normal work of breathing Abdomen: Nondistended, bowel sounds present MSK: No synovitis, joint effusion Neuro: Normal gait, no weakness Psych: Normal mood, full affect  Assessment & Plan:   Multifocal infiltrates: Likely related to atypical pneumonia. Most symptoms improving with antibiotic therapy. CT chest end 6/22 with improving GGOs reflective of healing inflammatory process.  Asthma: Nasal congestion, atopic symptoms.  Breo with some help but still with ongoing symptoms.  Trelegy helps but cost prohibitive.  Samples of Trelegy today.  Consider manufacturing assistance in the future.  Discussed role of Biologics but patient is hesitant to pursue this.  Nasal allergies: Continue  Zyrtec montelukast.  Increase Flonase to 2 sprays twice nightly for 1 week then decrease to 1 spray each nare nightly thereafter.  Add Afrin 2 sprays each nostril twice a day for 3 days then stop.   Return in about 3 months (around 07/19/2021).   Lanier Clam, MD 04/23/2021

## 2021-04-25 ENCOUNTER — Ambulatory Visit: Payer: BC Managed Care – PPO | Admitting: Podiatry

## 2021-04-27 ENCOUNTER — Ambulatory Visit (INDEPENDENT_AMBULATORY_CARE_PROVIDER_SITE_OTHER): Payer: BC Managed Care – PPO | Admitting: Podiatry

## 2021-04-27 ENCOUNTER — Other Ambulatory Visit: Payer: Self-pay

## 2021-04-27 DIAGNOSIS — L600 Ingrowing nail: Secondary | ICD-10-CM | POA: Diagnosis not present

## 2021-05-02 NOTE — Progress Notes (Signed)
Subjective: Vanessa Gordon is a 60 y.o.  female returns to office today for follow up evaluation after having bilateral Hallux Medial border nail avulsion performed. Patient has been soaking using epsom salt and applying topical antibiotic covered with bandaid daily. Patient denies fevers, chills, nausea, vomiting. Denies any calf pain, chest pain, SOB.   Objective:  Vitals: Reviewed  General: Well developed, nourished, in no acute distress, alert and oriented x3   Dermatology: Skin is warm, dry and supple bilateral. Medial hallux nail border appears to be clean, dry, with mild granular tissue and surrounding scab. There is no surrounding erythema, edema, drainage/purulence. The remaining nails appear unremarkable at this time. There are no other lesions or other signs of infection present.  Neurovascular status: Intact. No lower extremity swelling; No pain with calf compression bilateral.  Musculoskeletal: Decreased tenderness to palpation of the Medial hallux nail fold(s). Muscular strength within normal limits bilateral.   Assesement and Plan: S/p partial nail avulsion, doing well.   -Continue soaking in epsom salts twice a day followed by antibiotic ointment and a band-aid as needed. Can leave uncovered at night. .  -If the area does not heal properply, call the office for follow-up appointment, or sooner if any problems arise.  -Monitor for any signs/symptoms of infection. Call the office immediately if any occur or go directly to the emergency room. Call with any questions/concerns.  Boneta Lucks, DPM

## 2021-05-24 DIAGNOSIS — J019 Acute sinusitis, unspecified: Secondary | ICD-10-CM | POA: Diagnosis not present

## 2021-05-24 DIAGNOSIS — I10 Essential (primary) hypertension: Secondary | ICD-10-CM | POA: Diagnosis not present

## 2021-05-31 ENCOUNTER — Telehealth: Payer: Self-pay | Admitting: Pulmonary Disease

## 2021-05-31 NOTE — Telephone Encounter (Signed)
Called patient and let her know that she can stop by the office anytime Monday through Friday 8:30-5 and pick up a patient assistance packet to fill out for her medication and the [facility] will fill out his portion and we will fax it off. Nthing further needed

## 2021-06-14 DIAGNOSIS — J019 Acute sinusitis, unspecified: Secondary | ICD-10-CM | POA: Diagnosis not present

## 2021-07-19 ENCOUNTER — Ambulatory Visit (INDEPENDENT_AMBULATORY_CARE_PROVIDER_SITE_OTHER): Payer: BC Managed Care – PPO | Admitting: Pulmonary Disease

## 2021-07-19 ENCOUNTER — Encounter: Payer: Self-pay | Admitting: Pulmonary Disease

## 2021-07-19 ENCOUNTER — Other Ambulatory Visit: Payer: Self-pay

## 2021-07-19 VITALS — BP 142/82 | HR 85 | Temp 98.2°F | Ht 68.0 in | Wt 178.8 lb

## 2021-07-19 DIAGNOSIS — R059 Cough, unspecified: Secondary | ICD-10-CM

## 2021-07-19 DIAGNOSIS — J4540 Moderate persistent asthma, uncomplicated: Secondary | ICD-10-CM | POA: Diagnosis not present

## 2021-07-19 DIAGNOSIS — R053 Chronic cough: Secondary | ICD-10-CM

## 2021-07-19 MED ORDER — ALBUTEROL SULFATE HFA 108 (90 BASE) MCG/ACT IN AERS: 1.0000 | INHALATION_SPRAY | RESPIRATORY_TRACT | 6 refills | Status: AC | PRN

## 2021-07-19 MED ORDER — TRELEGY ELLIPTA 200-62.5-25 MCG/ACT IN AEPB: 1.0000 | INHALATION_SPRAY | Freq: Every day | RESPIRATORY_TRACT | 6 refills | Status: DC

## 2021-07-19 MED ORDER — MONTELUKAST SODIUM 10 MG PO TABS: 10.0000 mg | ORAL_TABLET | Freq: Every day | ORAL | 11 refills | Status: DC

## 2021-07-19 MED ORDER — TRELEGY ELLIPTA 200-62.5-25 MCG/ACT IN AEPB: 1.0000 | INHALATION_SPRAY | Freq: Every day | RESPIRATORY_TRACT | 0 refills | Status: DC

## 2021-07-19 NOTE — Patient Instructions (Signed)
Nice to see you again ? ?Use Nasacort 1 spray each nostril twice a day for a few days at a time when pollens and symptoms seem to worsen, then go back daily for several days. ? ?Continue the Trelegy, the montelukast, and the zyrtec.  ? ?Return to clinic in 3 months or sooner as needed ? ? ?

## 2021-07-23 NOTE — Progress Notes (Signed)
? ?'@Patient'$  ID: Vanessa Gordon, female    DOB: [birth year], 61 y.o.   MRN: [id number] ? ?Chief Complaint  ?Patient presents with  ? Follow-up  ?  Follow up. Pt states that her asthma is doing well. She states she is taking her inhalers every morning and the singular at bedtime   ? ? ?Referring provider: ?Shanon Rosser, PA-C ? ?HPI:  ? ?61 y.o. whom we are seeing in follow up for evaluation of abnormality on CT scan, cough, asthma.  ? ?Doing ok. Trelegy still helpful. Working on patient assistance. Samples today. Greatly reduces symptoms burden. Worse nasal congestion last couple of weeks. Coincides with emergence of spring pollens. ? ?HPI at initial visit: ?At the time, patient complained about 1 week history of shortness of breath, nonproductive cough.  Associate with body aches.  She had a chest x-ray presented by her PCP: Antibiotics.  Did not improve on moxifloxacin.  Recommended come to the ED.  There she denies any fevers.  Was having seasonal allergies.  Endorse some chest discomfort after coughing.  CT scan performed at that time reviewed and interpreted as bilateral upper lobe groundglass but more dense lower lobe infiltrates.  Was placed on antibiotics and sent home.  Gradually symptoms improved.  She has persistent cough and mild dyspnea exertion.  Worse on inclines or stairs.  No time during things are better or worse.  Cough is nonproductive.  She has worsening allergy symptoms, runny nose, watery eyes over the last few weeks. ? ?PMH: Seasonal allergies ?Surgical history: Cholecystectomy, hysterectomy ?Family history: Mother CAD, hypertension, asthma ?Social History: Never smoker, lives in [place] ? ? ?Questionaires / Pulmonary Flowsheets:  ? ?ACT:  ?No flowsheet data found. ? ?MMRC: ?No flowsheet data found. ? ?Epworth:  ?No flowsheet data found. ? ?Tests:  ? ?FENO:  ?No results found for: NITRICOXIDE ? ?PFT: ?No flowsheet data found. ? ?WALK:  ?No flowsheet data found. ? ?Imaging: ?Personally  reviewed and as per EMR discussion this note. ?No results found. ? ? ?Lab Results: ?Personally reviewed and as per EMR, no anemia ?CBC ?   ?Component Value Date/Time  ? WBC 10.4 08/23/2020 1724  ? RBC 4.63 08/23/2020 1724  ? HGB 13.8 08/23/2020 1724  ? HCT 42.4 08/23/2020 1724  ? PLT 456 (H) 08/23/2020 1724  ? MCV 91.6 08/23/2020 1724  ? MCH 29.8 08/23/2020 1724  ? MCHC 32.5 08/23/2020 1724  ? RDW 13.2 08/23/2020 1724  ? LYMPHSABS 2.5 08/23/2020 1724  ? MONOABS 0.7 08/23/2020 1724  ? EOSABS 0.2 08/23/2020 1724  ? BASOSABS 0.1 08/23/2020 1724  ? ? ?BMET ?   ?Component Value Date/Time  ? NA 137 08/23/2020 1724  ? K 4.7 08/23/2020 1724  ? CL 105 08/23/2020 1724  ? CO2 25 08/23/2020 1724  ? GLUCOSE 112 (H) 08/23/2020 1724  ? BUN 15 08/23/2020 1724  ? CREATININE 0.94 08/23/2020 1724  ? CALCIUM 8.8 (L) 08/23/2020 1724  ? GFRNONAA >60 08/23/2020 1724  ? GFRAA 59 (L) 03/20/2017 1830  ? ? ?BNP ?No results found for: BNP ? ?ProBNP ?No results found for: PROBNP ? ?Specialty Problems   ?None ? ?Allergies  ?Allergen Reactions  ? Honey Bee Venom Protein [Honey Bee Venom]   ? Penicillins Hives  ?  Has patient had a PCN reaction causing immediate rash, facial/tongue/throat swelling, SOB or lightheadedness with hypotension: Yes ?Has patient had a PCN reaction causing severe rash involving mucus membranes or skin necrosis: No ?Has patient had a PCN reaction that required  hospitalization No ?Has patient had a PCN reaction occurring within the last 10 years: No ?If all of the above answers are "NO", then may proceed with Cephalosporin use. ?  ? ? ?Immunization History  ?Administered Date(s) Administered  ? PFIZER(Purple Top)SARS-COV-2 Vaccination 05/19/2020  ? ? ?Past Medical History:  ?Diagnosis Date  ? Acid reflux   ? ? ?Tobacco History: ?Social History  ? ?Tobacco Use  ?Smoking Status Never  ?Smokeless Tobacco Never  ? ?Counseling given: Not Answered ? ? ?Continue to not smoke ? ?Outpatient Encounter Medications as of 07/19/2021   ?Medication Sig  ? azelastine (ASTELIN) 0.1 % nasal spray daily at 6 (six) AM.  ? Black Cohosh 40 MG CAPS Take 40 mg by mouth daily.  ? cetirizine (ZYRTEC) 10 MG tablet Take 10 mg by mouth daily.  ? EPINEPHrine 0.3 mg/0.3 mL IJ SOAJ injection See admin instructions.  ? famotidine (PEPCID) 20 MG tablet Take 1 tablet by mouth daily at 6 (six) AM.  ? Fluticasone-Umeclidin-Vilant (TRELEGY ELLIPTA) 200-62.5-25 MCG/ACT AEPB Inhale 1 puff into the lungs daily.  ? Fluticasone-Umeclidin-Vilant (TRELEGY ELLIPTA) 200-62.5-25 MCG/ACT AEPB Inhale 1 puff into the lungs daily.  ? Fluticasone-Umeclidin-Vilant (TRELEGY ELLIPTA) 200-62.5-25 MCG/ACT AEPB Inhale 1 puff into the lungs daily.  ? folic acid (FOLVITE) 1 MG tablet Take 1 mg by mouth daily.   ? Multiple Vitamin (MULTIVITAMIN) tablet Take 1 tablet by mouth daily.  ? multivitamin-lutein (OCUVITE-LUTEIN) CAPS capsule Take 1 capsule by mouth daily.  ? [DISCONTINUED] albuterol (VENTOLIN HFA) 108 (90 Base) MCG/ACT inhaler as needed.  ? [DISCONTINUED] Fexofenadine-Pseudoephedrine (ALLEGRA-D 12 HOUR PO) Take 1 tablet daily as needed by mouth (allergies).  ? [DISCONTINUED] montelukast (SINGULAIR) 10 MG tablet Take 1 tablet (10 mg total) by mouth at bedtime.  ? albuterol (VENTOLIN HFA) 108 (90 Base) MCG/ACT inhaler Inhale 1-2 puffs into the lungs every 4 (four) hours as needed.  ? montelukast (SINGULAIR) 10 MG tablet Take 1 tablet (10 mg total) by mouth at bedtime.  ? ?No facility-administered encounter medications on file as of 07/19/2021.  ? ? ? ?Review of Systems ? ?Review of Systems  ?N/a ?Physical Exam ? ?BP (!) 142/82 (BP Location: Left Arm, Patient Position: Sitting, Cuff Size: Normal)   Pulse 85   Temp 98.2 ?F (36.8 ?C) (Oral)   Ht '5\' 8"'$  (1.727 m)   Wt 178 lb 12.8 oz (81.1 kg)   SpO2 99%   BMI 27.19 kg/m?  ? ?Wt Readings from Last 5 Encounters:  ?07/19/21 178 lb 12.8 oz (81.1 kg)  ?04/20/21 170 lb (77.1 kg)  ?01/03/21 166 lb (75.3 kg)  ?12/06/20 165 lb (74.8 kg)   ?09/12/20 168 lb (76.2 kg)  ? ? ?BMI Readings from Last 5 Encounters:  ?07/19/21 27.19 kg/m?  ?04/20/21 25.85 kg/m?  ?01/03/21 26.00 kg/m?  ?12/06/20 25.09 kg/m?  ?09/12/20 25.54 kg/m?  ? ? ? ?Physical Exam ?General: Well-appearing, no acute distress ?Eyes: EOMI, no icterus ?Neck: Supple, no JVP appreciated ?Cardiovascular: Regular rate and rhythm, no murmurs ?Pulmonary: Clear to auscultation bilaterally, no wheezing, normal work of breathing ?Abdomen: Nondistended, bowel sounds present ?MSK: No synovitis, joint effusion ?Neuro: Normal gait, no weakness ?Psych: Normal mood, full affect ? ?Assessment & Plan:  ? ?Multifocal infiltrates: Likely related to atypical pneumonia. Most symptoms improving with antibiotic therapy. CT chest end 6/22 with improving GGOs reflective of healing inflammatory process. ? ?Asthma: Nasal congestion, atopic symptoms.  Breo with some help but still with ongoing symptoms.  Trelegy helps but cost prohibitive.  Samples  of Trelegy today.  In process of manufacturing assistance.  Discussed role of Biologics in past but patient is hesitant to pursue this. Fortunately triple inhaled therapy has helped.  ? ?Nasal allergies: Continue  Zyrtec and montelukast.  Increase Nasacort to 2 sprays twice nightly for 1 week then decrease to 1 spray each nare nightly thereafter. To escalate dose as needed for nasal symptoms, pollens.. ? ? ?Return in about 3 months (around 10/19/2021). ? ? ?Lanier Clam, MD ?07/23/2021 ? ? ?

## 2021-08-10 ENCOUNTER — Telehealth: Payer: Self-pay | Admitting: Pulmonary Disease

## 2021-08-13 NOTE — Telephone Encounter (Signed)
Called patient and informed her the her trelegy samples will be upfront and at the desk . 2 boxes of trelegy 200 signed out and charted. Nothing further  ?

## 2021-08-14 DIAGNOSIS — H0012 Chalazion right lower eyelid: Secondary | ICD-10-CM | POA: Diagnosis not present

## 2021-09-05 DIAGNOSIS — J019 Acute sinusitis, unspecified: Secondary | ICD-10-CM | POA: Diagnosis not present

## 2021-09-05 DIAGNOSIS — M25512 Pain in left shoulder: Secondary | ICD-10-CM | POA: Diagnosis not present

## 2021-09-14 ENCOUNTER — Telehealth: Payer: Self-pay | Admitting: Pulmonary Disease

## 2021-09-14 MED ORDER — TRELEGY ELLIPTA 200-62.5-25 MCG/ACT IN AEPB: 1.0000 | INHALATION_SPRAY | Freq: Every day | RESPIRATORY_TRACT | 0 refills | Status: DC

## 2021-09-14 NOTE — Telephone Encounter (Signed)
Spoke with the pt ?She is asking for sample of trelegy  ?She has high deductible and unable to afford copay at this time  ?She states has 3 doses left currently  ?I left 1 up front that she can pick up 09/17/21  ?Nothing further needed ?

## 2021-10-02 ENCOUNTER — Telehealth: Payer: Self-pay | Admitting: Pulmonary Disease

## 2021-10-02 NOTE — Telephone Encounter (Signed)
Called patient and let her know that her sample of Trelegy 200 would be at the front for her and that at her visit next week we will discuss a different inhaler since she got denied for [place] assistance. Nothing further needed

## 2021-10-02 NOTE — Telephone Encounter (Signed)
Trelegy 200. No reason to take breo with trelegy as duplicates medication. My recollection is that we were waiting on manufacturer assistance with Trelegy. Not sure where we are in that process.

## 2021-10-02 NOTE — Telephone Encounter (Signed)
Mh patient is wanting to also take Breo with her Trelegy inhaler. She is wanting samples of each one here at the office. I only see that we have her on Trelegy 200.   Please advise sir

## 2021-10-10 ENCOUNTER — Ambulatory Visit (INDEPENDENT_AMBULATORY_CARE_PROVIDER_SITE_OTHER): Payer: BC Managed Care – PPO | Admitting: Pulmonary Disease

## 2021-10-10 ENCOUNTER — Encounter: Payer: Self-pay | Admitting: Pulmonary Disease

## 2021-10-10 VITALS — BP 120/74 | HR 92 | Temp 98.2°F | Ht 68.0 in | Wt 182.0 lb

## 2021-10-10 DIAGNOSIS — J4540 Moderate persistent asthma, uncomplicated: Secondary | ICD-10-CM

## 2021-10-10 DIAGNOSIS — J302 Other seasonal allergic rhinitis: Secondary | ICD-10-CM

## 2021-10-10 NOTE — Progress Notes (Signed)
$'@Patient'L$  ID: Vanessa Gordon, female    DOB: 08-22-1960, 61 y.o.   MRN: [id number]  Chief Complaint  Patient presents with   Follow-up    Pt is here for follow up with her asthma. Pt states she believes her asthma is doing better. Pt is on Trelegy daily and Albuterol as needed Was denied for [place] for Trelegy, needs to discuss other options.     Referring provider: Shanon Rosser, PA-C  HPI:   61 y.o. whom we are seeing in follow up for evaluation of abnormality on CT scan, cough, asthma.   Doing ok. Trelegy still helpful. Denied patient/manufacturing assistance.   HPI at initial visit: At the time, patient complained about 1 week history of shortness of breath, nonproductive cough.  Associate with body aches.  She had a chest x-ray presented by her PCP: Antibiotics.  Did not improve on moxifloxacin.  Recommended come to the ED.  There she denies any fevers.  Was having seasonal allergies.  Endorse some chest discomfort after coughing.  CT scan performed at that time reviewed and interpreted as bilateral upper lobe groundglass but more dense lower lobe infiltrates.  Was placed on antibiotics and sent home.  Gradually symptoms improved.  She has persistent cough and mild dyspnea exertion.  Worse on inclines or stairs.  No time during things are better or worse.  Cough is nonproductive.  She has worsening allergy symptoms, runny nose, watery eyes over the last few weeks.  PMH: Seasonal allergies Surgical history: Cholecystectomy, hysterectomy Family history: Mother CAD, hypertension, asthma Social History: Never smoker, lives in [place] / Pulmonary Flowsheets:   ACT:  Asthma Control Test ACT Total Score  10/10/2021  4:20 PM 22   MMRC:     View : No data to display.          Epworth:      View : No data to display.          Tests:   FENO:  No results found for: NITRICOXIDE  PFT:     View : No data to display.          WALK:      View : No  data to display.          Imaging: Personally reviewed and as per EMR discussion this note. No results found.   Lab Results: Personally reviewed and as per EMR, no anemia CBC    Component Value Date/Time   WBC 10.4 08/23/2020 1724   RBC 4.63 08/23/2020 1724   HGB 13.8 08/23/2020 1724   HCT 42.4 08/23/2020 1724   PLT 456 (H) 08/23/2020 1724   MCV 91.6 08/23/2020 1724   MCH 29.8 08/23/2020 1724   MCHC 32.5 08/23/2020 1724   RDW 13.2 08/23/2020 1724   LYMPHSABS 2.5 08/23/2020 1724   MONOABS 0.7 08/23/2020 1724   EOSABS 0.2 08/23/2020 1724   BASOSABS 0.1 08/23/2020 1724    BMET    Component Value Date/Time   NA 137 08/23/2020 1724   K 4.7 08/23/2020 1724   CL 105 08/23/2020 1724   CO2 25 08/23/2020 1724   GLUCOSE 112 (H) 08/23/2020 1724   BUN 15 08/23/2020 1724   CREATININE 0.94 08/23/2020 1724   CALCIUM 8.8 (L) 08/23/2020 1724   GFRNONAA >60 08/23/2020 1724   GFRAA 59 (L) 03/20/2017 1830    BNP No results found for: BNP  ProBNP No results found for: PROBNP  Specialty Problems   None  Allergies  Allergen Reactions   Honey Bee Venom Protein [Honey Bee Venom]    Penicillins Hives    Has patient had a PCN reaction causing immediate rash, facial/tongue/throat swelling, SOB or lightheadedness with hypotension: Yes Has patient had a PCN reaction causing severe rash involving mucus membranes or skin necrosis: No Has patient had a PCN reaction that required hospitalization No Has patient had a PCN reaction occurring within the last 10 years: No If all of the above answers are "NO", then may proceed with Cephalosporin use.     Immunization History  Administered Date(s) Administered   PFIZER(Purple Top)SARS-COV-2 Vaccination 05/19/2020    Past Medical History:  Diagnosis Date   Acid reflux     Tobacco History: Social History   Tobacco Use  Smoking Status Never  Smokeless Tobacco Never   Counseling given: Not Answered   Continue to not  smoke  Outpatient Encounter Medications as of 62/31/2023  Medication Sig   albuterol (VENTOLIN HFA) 108 (90 Base) MCG/ACT inhaler Inhale 1-2 puffs into the lungs every 4 (four) hours as needed.   azelastine (ASTELIN) 0.1 % nasal spray daily at 6 (six) AM.   Black Cohosh 40 MG CAPS Take 40 mg by mouth daily.   cetirizine (ZYRTEC) 10 MG tablet Take 10 mg by mouth daily.   EPINEPHrine 0.3 mg/0.3 mL IJ SOAJ injection See admin instructions.   famotidine (PEPCID) 20 MG tablet Take 1 tablet by mouth daily at 6 (six) AM.   Fluticasone-Umeclidin-Vilant (TRELEGY ELLIPTA) 200-62.5-25 MCG/ACT AEPB Inhale 1 puff into the lungs daily.   folic acid (FOLVITE) 1 MG tablet Take 1 mg by mouth daily.    montelukast (SINGULAIR) 10 MG tablet Take 1 tablet (10 mg total) by mouth at bedtime.   Multiple Vitamin (MULTIVITAMIN) tablet Take 1 tablet by mouth daily.   multivitamin-lutein (OCUVITE-LUTEIN) CAPS capsule Take 1 capsule by mouth daily.   [DISCONTINUED] Fluticasone-Umeclidin-Vilant (TRELEGY ELLIPTA) 200-62.5-25 MCG/ACT AEPB Inhale 1 puff into the lungs daily.   [DISCONTINUED] Fluticasone-Umeclidin-Vilant (TRELEGY ELLIPTA) 200-62.5-25 MCG/ACT AEPB Inhale 1 puff into the lungs daily.   [DISCONTINUED] Fluticasone-Umeclidin-Vilant (TRELEGY ELLIPTA) 200-62.5-25 MCG/ACT AEPB Inhale 1 puff into the lungs daily.   No facility-administered encounter medications on file as of 10/10/2021.     Review of Systems  Review of Systems  N/a Physical Exam  BP 120/74 (BP Location: Right Arm, Patient Position: Sitting, Cuff Size: Normal)   Pulse 92   Temp 98.2 F (36.8 C) (Oral)   Ht '5\' 8"'$  (1.727 m)   Wt 182 lb (82.6 kg)   SpO2 99%   BMI 27.67 kg/m   Wt Readings from Last 5 Encounters:  10/10/21 182 lb (82.6 kg)  07/19/21 178 lb 12.8 oz (81.1 kg)  04/20/21 170 lb (77.1 kg)  01/03/21 166 lb (75.3 kg)  12/06/20 165 lb (74.8 kg)    BMI Readings from Last 5 Encounters:  10/10/21 27.67 kg/m  07/19/21 27.19  kg/m  04/20/21 25.85 kg/m  01/03/21 26.00 kg/m  12/06/20 25.09 kg/m     Physical Exam General: Well-appearing, no acute distress Eyes: EOMI, no icterus Neck: Supple, no JVP appreciated Cardiovascular: Regular rate and rhythm, no murmurs Pulmonary: Clear to auscultation bilaterally, no wheezing, normal work of breathing Abdomen: Nondistended, bowel sounds present MSK: No synovitis, joint effusion Neuro: Normal gait, no weakness Psych: Normal mood, full affect  Assessment & Plan:   Asthma: Nasal congestion, atopic symptoms.  Breo with some help but still with ongoing symptoms.  Trelegy helps but cost prohibitive.  Manufacturing assistance denied. Copay card for trelegy today. If still not obtainable will trial Breztri with copay card if needed. Discussed role of Biologics in past but patient is hesitant to pursue this. Fortunately triple inhaled therapy has helped.   Nasal allergies: Continue  Zyrtec and montelukast. Nasacort 1 spray each nare nightly . To escalate dose as needed for nasal symptoms, pollens..   Return in about 3 months (around 01/10/2022).   Lanier Clam, MD 10/10/2021

## 2021-10-10 NOTE — Patient Instructions (Signed)
Nice to see you again  Provided Trelegy co-pay card.  Let see if this helps bring down the cost to a reasonable level.  If not, I also provided the [facility] co-pay card.  If the Trelegy cannot be obtained, please call me and I will send a prescription for [facility].  If the Judithann Sauger fails with a co-pay card please let me know.  We will keep looking for a solution.  I will need to ask my pharmacy colleagues to help.  We will keep working on it.  Please keep me in the loop via MyChart messages if you are able.  Try to locate the [place] letter when assistance was denied.  You should be able to take a picture of it and added as an attachment to a MyChart message and we can review the letter and see if we can troubleshoot anything.  Return to clinic in 3 months or sooner if needed with Dr. Silas Flood

## 2021-11-02 IMAGING — CT CT CHEST W/ CM
2 of 3 series · 15 of 36 positions shown, 18 images · IV contrast (Omni 300)
Comparison: August 03, 2014

CLINICAL DATA: Evaluate for pneumonia.

EXAM:
CT CHEST WITH CONTRAST
TECHNIQUE: Multidetector CT imaging of the chest was performed during
intravenous contrast administration.
CONTRAST:  75mL OMNIPAQUE IOHEXOL 300 MG/ML  SOLN

[Series 3: chest with 2mm st · axial · 0.91mm/px · z∈[+1134,+1398]mm · 12 of 156 slices shown, 15 images]
[im 12/156  mediastinal]
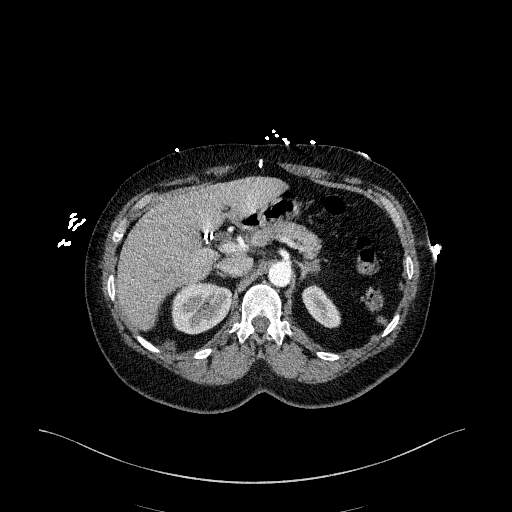
[im 12/156  lung]
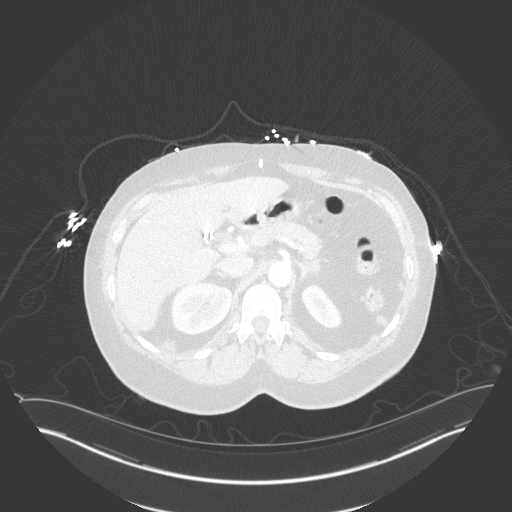
[im 23/156  lung]
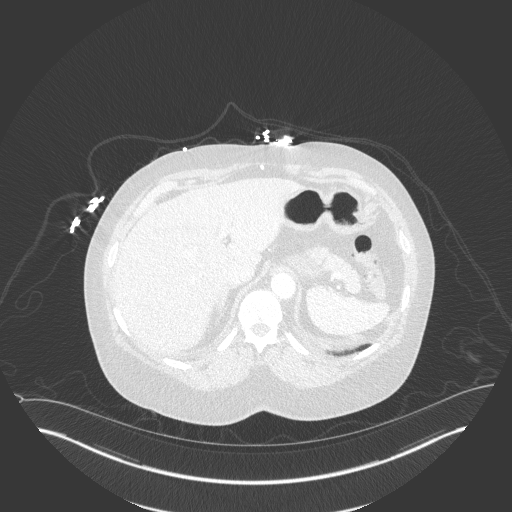
[im 35/156  lung]
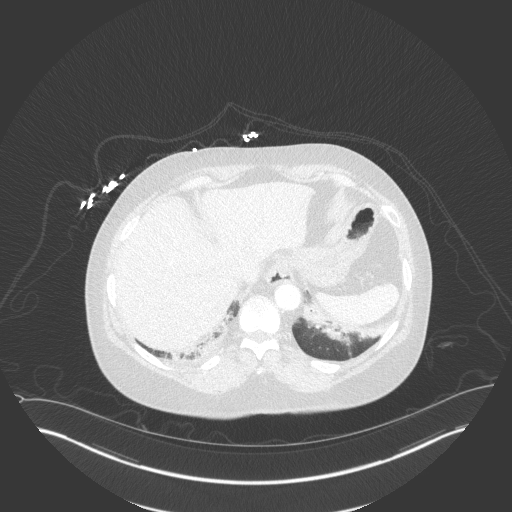
[im 46/156  lung]
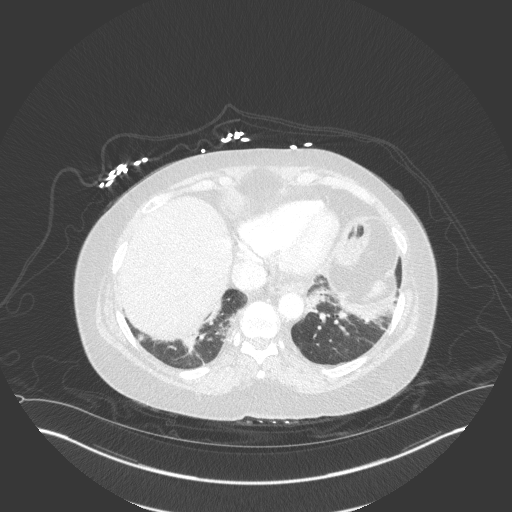
[im 58/156  mediastinal]
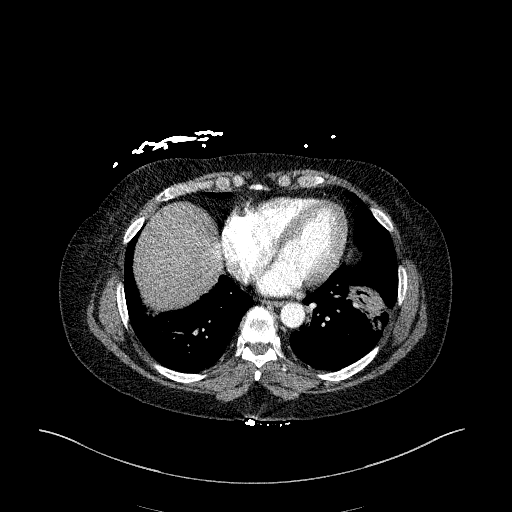
[im 58/156  lung]
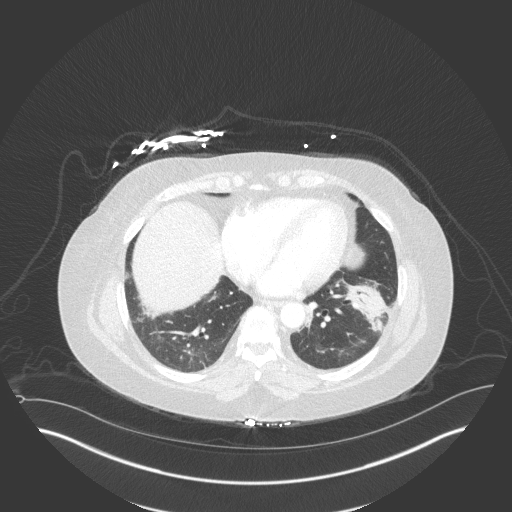
[im 69/156  lung]
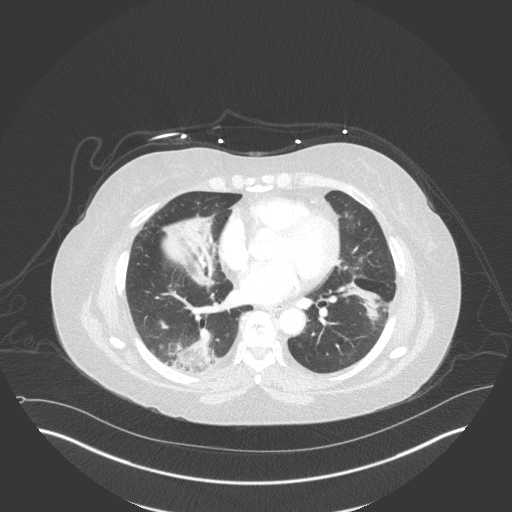
[im 87/156  lung]
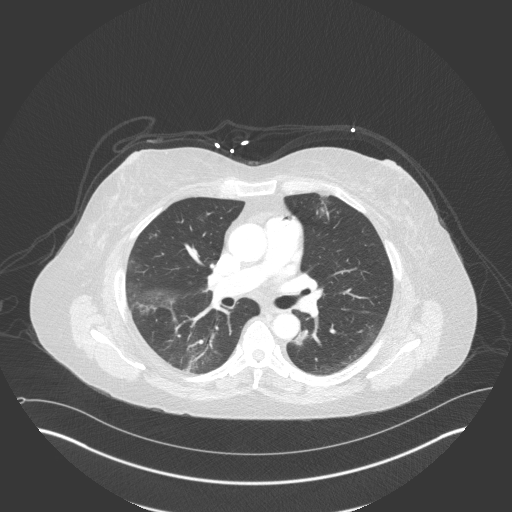
[im 98/156  lung]
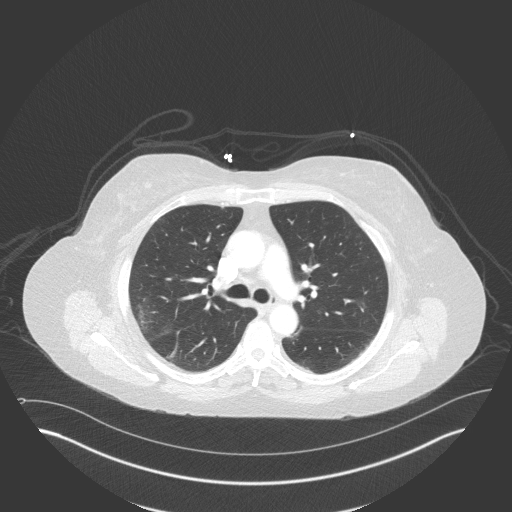
[im 110/156  mediastinal]
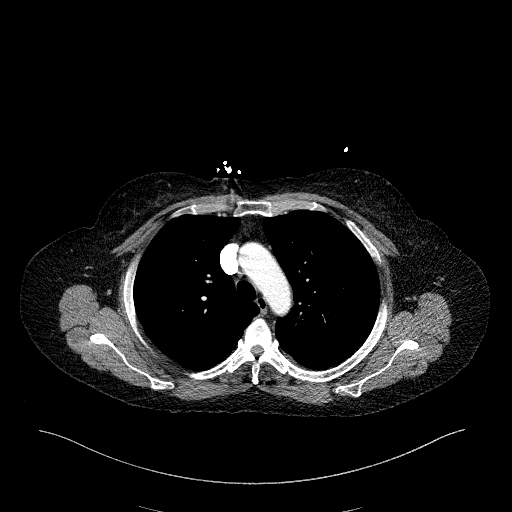
[im 110/156  lung]
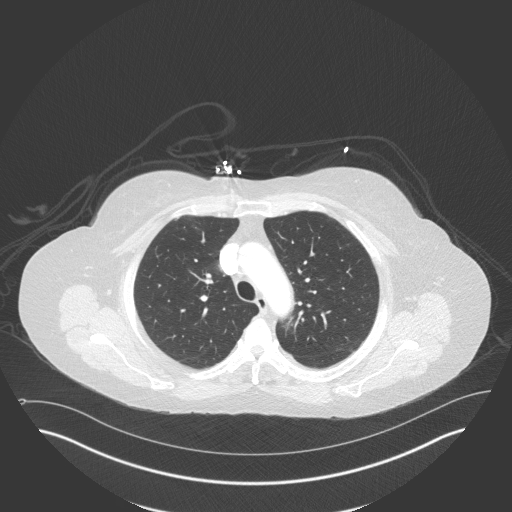
[im 121/156  lung]
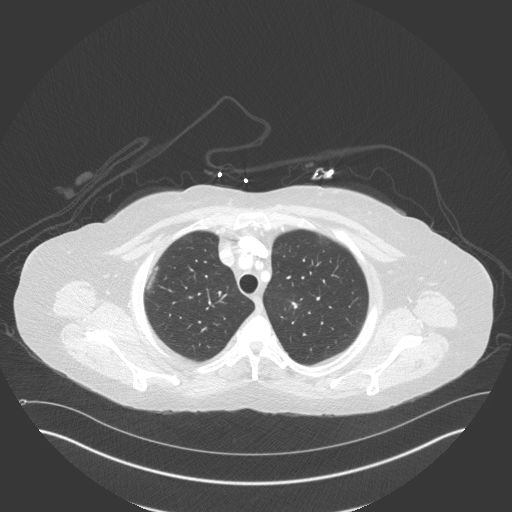
[im 133/156  lung]
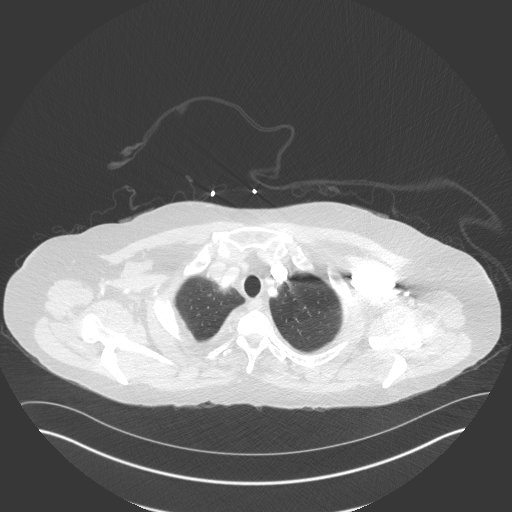
[im 144/156  lung]
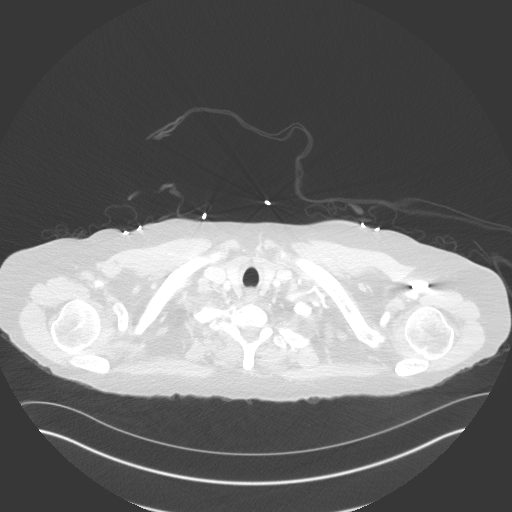

[Series 6: chest with 2mm st cor · coronal · 0.61mm/px · 3 of 149 slices shown]
[im 30/149  lung]
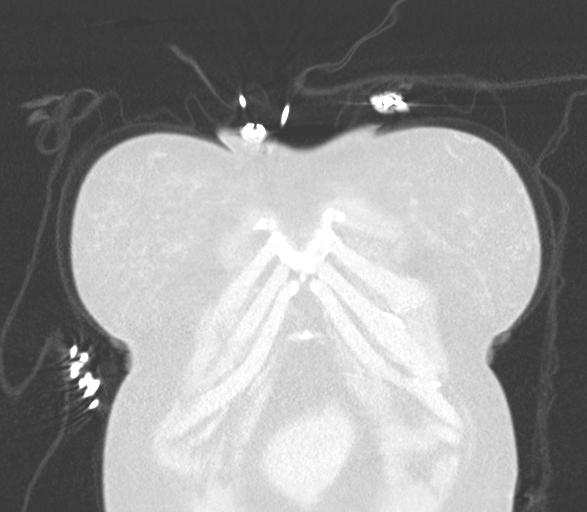
[im 60/149  lung]
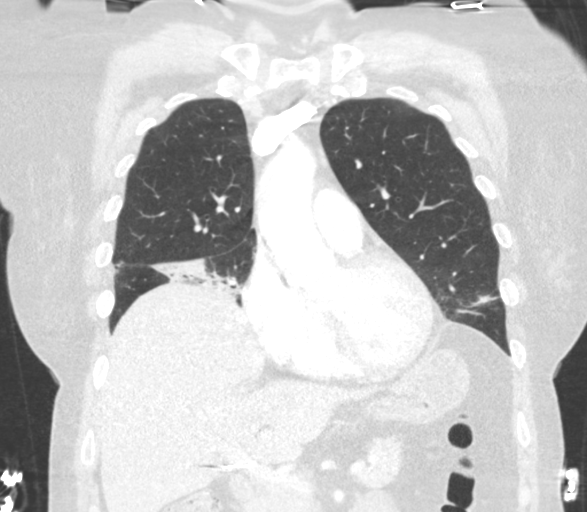
[im 89/149  lung]
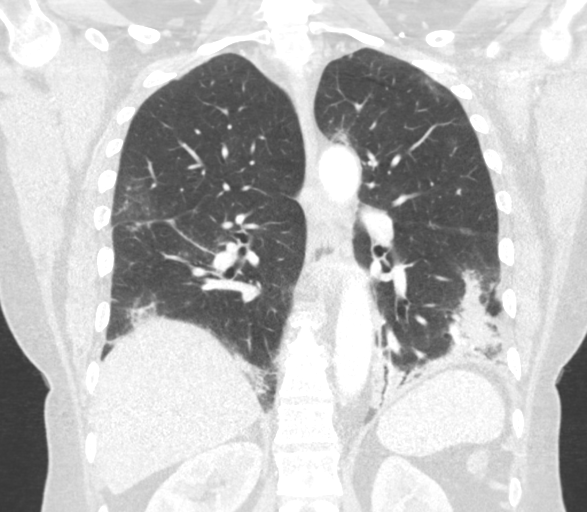

[15 of 36 positions shown; findings below may reference images not displayed]

FINDINGS: Cardiovascular: No significant vascular findings. Normal heart size.
No pericardial effusion.

Mediastinum/Nodes: No enlarged mediastinal, hilar, or axillary lymph
nodes. Thyroid gland, trachea, and esophagus demonstrate no
significant findings.

Lungs/Pleura: Bilateral moderate severity multifocal infiltrates are
seen, most prominent within the right middle lobe, left upper lobe
and bilateral lower lobes. Mild involvement of the peripheral
portions of the bilateral upper lobes is seen.

3 mm and 4 mm noncalcified lung nodules are seen within the
bilateral apices.

There is no evidence of a pleural effusion or pneumothorax.

Upper Abdomen: Multiple surgical clips are seen within the
gallbladder fossa.

Noninflamed diverticula are seen throughout the large bowel.

Musculoskeletal: No chest wall abnormality. No acute or significant
osseous findings.
IMPRESSION: 1. Bilateral moderate severity multifocal infiltrates, most
prominent within the right middle lobe, left upper lobe and
bilateral lower lobes.
2. 3 mm and 4 mm noncalcified biapical lung nodules. Non-contrast
chest CT at 3-6 months is recommended. If nodules persist and are
stable at that time, consider additional non-contrast chest CT
examinations at 2 and 4 years. This recommendation follows the
consensus statement: Guidelines for Management of Incidental
Pulmonary Nodules Detected on CT Images: From the [HOSPITAL]
3. Colonic diverticulosis.
4. Evidence of prior cholecystectomy.

## 2021-12-04 ENCOUNTER — Other Ambulatory Visit: Payer: Self-pay | Admitting: Pulmonary Disease

## 2021-12-04 DIAGNOSIS — R053 Chronic cough: Secondary | ICD-10-CM

## 2021-12-04 DIAGNOSIS — J4540 Moderate persistent asthma, uncomplicated: Secondary | ICD-10-CM

## 2022-01-02 ENCOUNTER — Encounter: Payer: Self-pay | Admitting: Pulmonary Disease

## 2022-01-02 ENCOUNTER — Ambulatory Visit (INDEPENDENT_AMBULATORY_CARE_PROVIDER_SITE_OTHER): Payer: BC Managed Care – PPO | Admitting: Pulmonary Disease

## 2022-01-02 ENCOUNTER — Other Ambulatory Visit (HOSPITAL_COMMUNITY): Payer: Self-pay

## 2022-01-02 VITALS — BP 118/66 | HR 65 | Ht 68.0 in | Wt 181.4 lb

## 2022-01-02 DIAGNOSIS — J4540 Moderate persistent asthma, uncomplicated: Secondary | ICD-10-CM

## 2022-01-02 DIAGNOSIS — J309 Allergic rhinitis, unspecified: Secondary | ICD-10-CM | POA: Diagnosis not present

## 2022-01-02 MED ORDER — BREZTRI AEROSPHERE 160-9-4.8 MCG/ACT IN AERO: 2.0000 | INHALATION_SPRAY | Freq: Two times a day (BID) | RESPIRATORY_TRACT | 3 refills | Status: DC

## 2022-01-02 NOTE — Patient Instructions (Addendum)
Nice to see you again  Try Breztri 2 puffs twice a day every day.  Rinse your mouth out with water after every use.  I provided a co-pay card.  I am hopeful this will be a better solution.  Please let me know if too expensive.  Stop Trelegy when using Breztri.  Use the Nasacort 1 spray each nostril in the morning and and in the evening for the next 2 weeks then can go back to using once daily in the morning.  Please contact me if I can help in any way.  Return to clinic in 3 months or sooner if needed with Dr. Silas Flood

## 2022-01-03 NOTE — Progress Notes (Signed)
$'@Patient'k$  ID: Vanessa Gordon, female    DOB: 10/06/1960, 61 y.o.   MRN: [id number]  Chief Complaint  Patient presents with   Follow-up    Follow up for asthma. Pt states the trelegy is helping but it is still expensive for her. Costing her over $400 a month. Pt states allergies are flaring up. She uses flonase during the day and Singular at night but it is still not helping. [place] assistance was denied. Tried Breztri and was more expensive then Trelegy. Alternative suggestions?    Referring provider: Shanon Rosser, PA-C  HPI:   61 y.o. whom we are seeing in follow up for evaluation of abnormality on CT scan, cough, asthma.   Doing ok. With copay card trelegy still $400 monthly which is unaffordable. Denied [employer].  Overall doing okay.  Some worsening nasal congestion lately.  Using Nasacort in the morning only.  HPI at initial visit: At the time, patient complained about 1 week history of shortness of breath, nonproductive cough.  Associate with body aches.  She had a chest x-ray presented by her PCP: Antibiotics.  Did not improve on moxifloxacin.  Recommended come to the ED.  There she denies any fevers.  Was having seasonal allergies.  Endorse some chest discomfort after coughing.  CT scan performed at that time reviewed and interpreted as bilateral upper lobe groundglass but more dense lower lobe infiltrates.  Was placed on antibiotics and sent home.  Gradually symptoms improved.  She has persistent cough and mild dyspnea exertion.  Worse on inclines or stairs.  No time during things are better or worse.  Cough is nonproductive.  She has worsening allergy symptoms, runny nose, watery eyes over the last few weeks.  PMH: Seasonal allergies Surgical history: Cholecystectomy, hysterectomy Family history: Mother CAD, hypertension, asthma Social History: Never smoker, lives in [place] / Pulmonary Flowsheets:   ACT:  Asthma Control Test ACT Total Score   01/02/2022  3:39 PM 22  10/10/2021  4:20 PM 22   MMRC:     No data to display           Epworth:      No data to display           Tests:   FENO:  No results found for: "NITRICOXIDE"  PFT:     No data to display           WALK:      No data to display           Imaging: Personally reviewed and as per EMR discussion this note. No results found.   Lab Results: Personally reviewed and as per EMR, no anemia CBC    Component Value Date/Time   WBC 10.4 08/23/2020 1724   RBC 4.63 08/23/2020 1724   HGB 13.8 08/23/2020 1724   HCT 42.4 08/23/2020 1724   PLT 456 (H) 08/23/2020 1724   MCV 91.6 08/23/2020 1724   MCH 29.8 08/23/2020 1724   MCHC 32.5 08/23/2020 1724   RDW 13.2 08/23/2020 1724   LYMPHSABS 2.5 08/23/2020 1724   MONOABS 0.7 08/23/2020 1724   EOSABS 0.2 08/23/2020 1724   BASOSABS 0.1 08/23/2020 1724    BMET    Component Value Date/Time   NA 137 08/23/2020 1724   K 4.7 08/23/2020 1724   CL 105 08/23/2020 1724   CO2 25 08/23/2020 1724   GLUCOSE 112 (H) 08/23/2020 1724   BUN 15 08/23/2020 1724   CREATININE 0.94 08/23/2020 1724  CALCIUM 8.8 (L) 08/23/2020 1724   GFRNONAA >60 08/23/2020 1724   GFRAA 59 (L) 03/20/2017 1830    BNP No results found for: "BNP"  ProBNP No results found for: "PROBNP"  Specialty Problems   None  Allergies  Allergen Reactions   Honey Bee Venom Protein [Honey Bee Venom]    Penicillins Hives    Has patient had a PCN reaction causing immediate rash, facial/tongue/throat swelling, SOB or lightheadedness with hypotension: Yes Has patient had a PCN reaction causing severe rash involving mucus membranes or skin necrosis: No Has patient had a PCN reaction that required hospitalization No Has patient had a PCN reaction occurring within the last 10 years: No If all of the above answers are "NO", then may proceed with Cephalosporin use.     Immunization History  Administered Date(s) Administered    PFIZER(Purple Top)SARS-COV-2 Vaccination 05/19/2020    Past Medical History:  Diagnosis Date   Acid reflux     Tobacco History: Social History   Tobacco Use  Smoking Status Never  Smokeless Tobacco Never   Counseling given: Not Answered   Continue to not smoke  Outpatient Encounter Medications as of 01/02/2022  Medication Sig   albuterol (VENTOLIN HFA) 108 (90 Base) MCG/ACT inhaler Inhale 1-2 puffs into the lungs every 4 (four) hours as needed.   azelastine (ASTELIN) 0.1 % nasal spray daily at 6 (six) AM.   Black Cohosh 40 MG CAPS Take 40 mg by mouth daily.   Budeson-Glycopyrrol-Formoterol (BREZTRI AEROSPHERE) 160-9-4.8 MCG/ACT AERO Inhale 2 puffs into the lungs in the morning and at bedtime.   cetirizine (ZYRTEC) 10 MG tablet Take 10 mg by mouth daily.   EPINEPHrine 0.3 mg/0.3 mL IJ SOAJ injection See admin instructions.   famotidine (PEPCID) 20 MG tablet Take 1 tablet by mouth daily at 6 (six) AM.   Fluticasone-Umeclidin-Vilant (TRELEGY ELLIPTA) 200-62.5-25 MCG/ACT AEPB Inhale 1 puff into the lungs daily.   folic acid (FOLVITE) 1 MG tablet Take 1 mg by mouth daily.    montelukast (SINGULAIR) 10 MG tablet TAKE 1 TABLET(10 MG) BY MOUTH AT BEDTIME   Multiple Vitamin (MULTIVITAMIN) tablet Take 1 tablet by mouth daily.   multivitamin-lutein (OCUVITE-LUTEIN) CAPS capsule Take 1 capsule by mouth daily.   No facility-administered encounter medications on file as of 01/02/2022.     Review of Systems  Review of Systems  N/a Physical Exam  BP 118/66 (BP Location: Left Arm, Patient Position: Sitting, Cuff Size: Normal)   Pulse 65   Ht '5\' 8"'$  (1.727 m)   Wt 181 lb 6.4 oz (82.3 kg)   SpO2 99%   BMI 27.58 kg/m   Wt Readings from Last 5 Encounters:  01/02/22 181 lb 6.4 oz (82.3 kg)  10/10/21 182 lb (82.6 kg)  07/19/21 178 lb 12.8 oz (81.1 kg)  04/20/21 170 lb (77.1 kg)  01/03/21 166 lb (75.3 kg)    BMI Readings from Last 5 Encounters:  01/02/22 27.58 kg/m  10/10/21  27.67 kg/m  07/19/21 27.19 kg/m  04/20/21 25.85 kg/m  01/03/21 26.00 kg/m     Physical Exam General: Well-appearing, no acute distress Eyes: EOMI, no icterus Neck: Supple, no JVP appreciated Cardiovascular: Regular rate and rhythm, no murmurs Pulmonary: Clear to auscultation bilaterally, no wheezing, normal work of breathing Abdomen: Nondistended, bowel sounds present MSK: No synovitis, joint effusion Neuro: Normal gait, no weakness Psych: Normal mood, full affect  Assessment & Plan:   Asthma: Nasal congestion, atopic symptoms.  Breo with some help but still with  ongoing symptoms.  Trelegy helps but cost prohibitive.  Manufacturing assistance denied. Copay card for trelegy provided at last visit but still too expensive.  Co-pay card and Breztri prescribed today.  I am running out of options in terms of inhaler therapy.  Very frustrating in terms of getting her medication she needs which does provide good control of her disease when available to her.  Nasal allergies: Worsening recent nasal congestion.  Continue  Zyrtec and montelukast.  Escalate Nasacort from 1 spray each nostril daily to twice daily for 2 weeks.  Return in about 3 months (around 04/04/2022).   Lanier Clam, MD 01/03/2022

## 2022-01-04 ENCOUNTER — Ambulatory Visit: Payer: BC Managed Care – PPO | Admitting: Pulmonary Disease

## 2022-01-15 DIAGNOSIS — Z1231 Encounter for screening mammogram for malignant neoplasm of breast: Secondary | ICD-10-CM | POA: Diagnosis not present

## 2022-01-22 DIAGNOSIS — R921 Mammographic calcification found on diagnostic imaging of breast: Secondary | ICD-10-CM | POA: Diagnosis not present

## 2022-01-24 DIAGNOSIS — N6012 Diffuse cystic mastopathy of left breast: Secondary | ICD-10-CM | POA: Diagnosis not present

## 2022-01-24 DIAGNOSIS — D242 Benign neoplasm of left breast: Secondary | ICD-10-CM | POA: Diagnosis not present

## 2022-01-24 DIAGNOSIS — R921 Mammographic calcification found on diagnostic imaging of breast: Secondary | ICD-10-CM | POA: Diagnosis not present

## 2022-02-03 DIAGNOSIS — R030 Elevated blood-pressure reading, without diagnosis of hypertension: Secondary | ICD-10-CM | POA: Diagnosis not present

## 2022-02-03 DIAGNOSIS — J019 Acute sinusitis, unspecified: Secondary | ICD-10-CM | POA: Diagnosis not present

## 2022-02-07 ENCOUNTER — Ambulatory Visit: Payer: Self-pay | Admitting: General Surgery

## 2022-02-07 DIAGNOSIS — N6092 Unspecified benign mammary dysplasia of left breast: Secondary | ICD-10-CM | POA: Diagnosis not present

## 2022-02-11 ENCOUNTER — Telehealth: Payer: Self-pay | Admitting: Pulmonary Disease

## 2022-02-11 MED ORDER — TRELEGY ELLIPTA 200-62.5-25 MCG/ACT IN AEPB: 1.0000 | INHALATION_SPRAY | Freq: Every day | RESPIRATORY_TRACT | 3 refills | Status: DC

## 2022-02-11 NOTE — Telephone Encounter (Signed)
Called and spoke with patient. Patient stated that her pharmacy reached out to [place] for a refill on her trelegy but they never got anything back. Advised patient I would send the prescription to the pharmacy. Patient verified pharmacy. Prescription has been sent.   Nothing further needed.

## 2022-02-14 ENCOUNTER — Telehealth: Payer: Self-pay | Admitting: Hematology and Oncology

## 2022-02-14 NOTE — Telephone Encounter (Signed)
Scheduled appt per 10/5 referral. Pt is aware of appt date and time. Pt is aware to arrive 15 mins prior to appt time and to bring and updated insurance card. Pt is aware of appt location.   

## 2022-02-20 ENCOUNTER — Other Ambulatory Visit: Payer: Self-pay | Admitting: Pulmonary Disease

## 2022-02-20 DIAGNOSIS — R0602 Shortness of breath: Secondary | ICD-10-CM

## 2022-02-20 DIAGNOSIS — R059 Cough, unspecified: Secondary | ICD-10-CM

## 2022-03-07 ENCOUNTER — Encounter (HOSPITAL_BASED_OUTPATIENT_CLINIC_OR_DEPARTMENT_OTHER): Payer: Self-pay | Admitting: General Surgery

## 2022-03-07 ENCOUNTER — Other Ambulatory Visit: Payer: Self-pay

## 2022-03-07 MED ORDER — CHLORHEXIDINE GLUCONATE CLOTH 2 % EX PADS: 6.0000 | MEDICATED_PAD | Freq: Once | CUTANEOUS | Status: DC

## 2022-03-07 NOTE — Progress Notes (Signed)

## 2022-03-12 ENCOUNTER — Inpatient Hospital Stay: Payer: BC Managed Care – PPO | Attending: Hematology and Oncology | Admitting: Hematology and Oncology

## 2022-03-12 ENCOUNTER — Inpatient Hospital Stay: Payer: BC Managed Care – PPO

## 2022-03-12 ENCOUNTER — Encounter: Payer: Self-pay | Admitting: Hematology and Oncology

## 2022-03-12 VITALS — BP 154/97 | HR 86 | Temp 97.6°F | Resp 18 | Ht 68.0 in | Wt 177.2 lb

## 2022-03-12 DIAGNOSIS — N6092 Unspecified benign mammary dysplasia of left breast: Secondary | ICD-10-CM | POA: Diagnosis not present

## 2022-03-12 NOTE — Progress Notes (Signed)
[place] CONSULT NOTE  Patient Care Team: Long, Nicki Reaper, PA-C as PCP - General (Physician Assistant)  CHIEF COMPLAINTS/PURPOSE OF CONSULTATION:  At high risk for breast cancer  ASSESSMENT & PLAN:  This is a very pleasant 61 yr old AAF with [place], scheduled for lumpectomy referred to high risk breast clinic for additional recommendations.   Atypical lobular hyperplasia: This is characterized by abnormal cells that are filling part of the lobule.  It appears to increase the risk of breast cancer by 3.7-5.3 fold.  There is risk of both ipsilateral and contralateral breast cancers.  The cumulative incidence of breast cancer is approximately 1 %/year.  Risk reduction strategies: I recommended appropriate lifestyle and dietary changes that include exercise, eating less red meat and increasing fruits and vegetables and weight loss Risk reduction with tamoxifen or raloxifene would reduce the risk by half.  Tamoxifen counseling: We discussed the risks and benefits of tamoxifen. These include but not limited to insomnia, hot flashes, mood changes, vaginal dryness, and weight gain. Although rare, serious side effects including endometrial cancer, risk of blood clots were also discussed. We strongly believe that the benefits far outweigh the risks. Patient understands these risks and consented to starting treatment. Planned treatment duration is 5 years.  Breast cancer surveillance: Annual mammograms, consider breast MRI because a lifetime risk of breast cancer risk of more than 20% along with breast exams.  We have discussed about the long-term unknown risk of gadolinium deposition with multiple MRIs as well as increased sensitivity of MRI.  She will proceed with lumpectomy.  She wants to think about tamoxifen as well as the MRI surveillance.  We have given her some printed material about tamoxifen.  She will return to clinic after lumpectomy to discuss further recommendations. I am unable to  pull her mammogram from [place].  We will try to get a copy of the report.  She was wondering if Dr. Marlou Starks will remove the area of calcifications as well as the possible papilloma.  She was encouraged to talk to him as well about this.  She will return to clinic after surgery to discuss further recommendations.  HISTORY OF PRESENTING ILLNESS:  Vanessa Gordon 61 y.o. female is here because of [place]  This is a very pleasant 61 year old female patient with atypical lobular hyperplasia, scheduled for lumpectomy referred to high risk breast clinic for further recommendations.  She denies any major complaints except for the atypical lobular hyperplasia.  No long-term use of birth control or hormone replacement therapy.  No family history of breast cancer.  She is healthy at baseline. Rest of the pertinent 10 point ROS reviewed and negative  REVIEW OF SYSTEMS:   Constitutional: Denies fevers, chills or abnormal night sweats Eyes: Denies blurriness of vision, double vision or watery eyes Ears, nose, mouth, throat, and face: Denies mucositis or sore throat Respiratory: Denies cough, dyspnea or wheezes Cardiovascular: Denies palpitation, chest discomfort or lower extremity swelling Gastrointestinal:  Denies nausea, heartburn or change in bowel habits Skin: Denies abnormal skin rashes Lymphatics: Denies new lymphadenopathy or easy bruising Neurological:Denies numbness, tingling or new weaknesses Behavioral/Psych: Mood is stable, no new changes  All other systems were reviewed with the patient and are negative.  MEDICAL HISTORY:  Past Medical History:  Diagnosis Date   Acid reflux    Asthma    Atypical hyperplasia of breast    Seasonal allergies     SURGICAL HISTORY: Past Surgical History:  Procedure Laterality Date   CESAREAN SECTION  1988   CHOLECYSTECTOMY      SOCIAL HISTORY: Social History   Socioeconomic History   Marital status: Single    Spouse name: Not on file   Number of  children: 1   Years of education: Not on file   Highest education level: Not on file  Occupational History   Not on file  Tobacco Use   Smoking status: Never   Smokeless tobacco: Never  Vaping Use   Vaping Use: Never used  Substance and Sexual Activity   Alcohol use: Yes    Comment: Occasionally    Drug use: No   Sexual activity: Not on file  Other Topics Concern   Not on file  Social History Narrative   Not on file   Social Determinants of Health   Financial Resource Strain: Not on file  Food Insecurity: Not on file  Transportation Needs: Not on file  Physical Activity: Not on file  Stress: Not on file  Social Connections: Not on file  Intimate Partner Violence: Not on file    FAMILY HISTORY: Family History  Problem Relation Age of Onset   Heart disease Mother    Hypertension Mother    Asthma Mother    Heart attack Father    Hypertension Brother    Renal Disease Brother     ALLERGIES:  is allergic to honey bee venom protein [honey bee venom] and penicillins.  MEDICATIONS:  Current Outpatient Medications  Medication Sig Dispense Refill   albuterol (VENTOLIN HFA) 108 (90 Base) MCG/ACT inhaler Inhale 1-2 puffs into the lungs every 4 (four) hours as needed. 6.7 g 6   azelastine (ASTELIN) 0.1 % nasal spray daily at 6 (six) AM.     Black Cohosh 40 MG CAPS Take 40 mg by mouth daily.     Budeson-Glycopyrrol-Formoterol (BREZTRI AEROSPHERE) 160-9-4.8 MCG/ACT AERO Inhale 2 puffs into the lungs in the morning and at bedtime. 1 each 3   cetirizine (ZYRTEC) 10 MG tablet Take 10 mg by mouth daily.     EPINEPHrine 0.3 mg/0.3 mL IJ SOAJ injection See admin instructions.     famotidine (PEPCID) 20 MG tablet Take 1 tablet by mouth daily at 6 (six) AM.     Fluticasone-Umeclidin-Vilant (TRELEGY ELLIPTA) 200-62.5-25 MCG/ACT AEPB Inhale 1 puff into the lungs daily. 1 each 3   folic acid (FOLVITE) 1 MG tablet Take 1 mg by mouth daily.   11   montelukast (SINGULAIR) 10 MG tablet TAKE  1 TABLET(10 MG) BY MOUTH AT BEDTIME 30 tablet 11   Multiple Vitamin (MULTIVITAMIN) tablet Take 1 tablet by mouth daily.     multivitamin-lutein (OCUVITE-LUTEIN) CAPS capsule Take 1 capsule by mouth daily.     No current facility-administered medications for this visit.   Facility-Administered Medications Ordered in Other Visits  Medication Dose Route Frequency Provider Last Rate Last Admin   Chlorhexidine Gluconate Cloth 2 % PADS 6 each  6 each Topical Once Autumn Messing III, MD       And   Chlorhexidine Gluconate Cloth 2 % PADS 6 each  6 each Topical Once Autumn Messing III, MD         PHYSICAL EXAMINATION: ECOG PERFORMANCE STATUS: 0 - Asymptomatic  Vitals:   03/12/22 1414  BP: (!) 154/97  Pulse: 86  Resp: 18  Temp: 97.6 F (36.4 C)  SpO2: 99%   Filed Weights   03/12/22 1414  Weight: 177 lb 3.2 oz (80.4 kg)    GENERAL:alert, no distress and comfortable Neck: No  cervical or infraclavicular lymphadenopathy Breast: Bilateral breasts inspected.  No palpable masses or regional adenopathy except in the left breast upper outer quadrant.  Patient tells me that the biopsy was done inferior to the palpable area of abnormality.  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  LABORATORY DATA:  I have reviewed the data as listed Lab Results  Component Value Date   WBC 10.4 08/23/2020   HGB 13.8 08/23/2020   HCT 42.4 08/23/2020   MCV 91.6 08/23/2020   PLT 456 (H) 08/23/2020     Chemistry      Component Value Date/Time   NA 137 08/23/2020 1724   K 4.7 08/23/2020 1724   CL 105 08/23/2020 1724   CO2 25 08/23/2020 1724   BUN 15 08/23/2020 1724   CREATININE 0.94 08/23/2020 1724      Component Value Date/Time   CALCIUM 8.8 (L) 08/23/2020 1724   ALKPHOS 105 08/23/2020 1724   AST 58 (H) 08/23/2020 1724   ALT 47 (H) 08/23/2020 1724   BILITOT 0.8 08/23/2020 1724       RADIOGRAPHIC STUDIES: I have personally reviewed the radiological images as listed and agreed with the  findings in the report. No results found.  All questions were answered. The patient knows to call the clinic with any problems, questions or concerns. I spent 45 minutes in the care of this patient including H and P, review of records, counseling and coordination of care.     Benay Pike, MD 03/12/2022 2:19 PM

## 2022-03-13 ENCOUNTER — Encounter: Payer: Self-pay | Admitting: Hematology and Oncology

## 2022-03-14 ENCOUNTER — Encounter (HOSPITAL_BASED_OUTPATIENT_CLINIC_OR_DEPARTMENT_OTHER): Payer: Self-pay | Admitting: General Surgery

## 2022-03-14 DIAGNOSIS — R921 Mammographic calcification found on diagnostic imaging of breast: Secondary | ICD-10-CM | POA: Diagnosis not present

## 2022-03-14 NOTE — Anesthesia Preprocedure Evaluation (Addendum)
Anesthesia Evaluation  Patient identified by MRN, date of birth, ID band Patient awake    Reviewed: Allergy & Precautions, NPO status , Patient's Chart, lab work & pertinent test results  Airway Mallampati: I  TM Distance: >3 FB Neck ROM: Full    Dental  (+) Teeth Intact, Dental Advisory Given   Pulmonary asthma    breath sounds clear to auscultation       Cardiovascular negative cardio ROS  Rhythm:Regular Rate:Normal     Neuro/Psych negative neurological ROS  negative psych ROS   GI/Hepatic Neg liver ROS,GERD  ,,  Endo/Other  negative endocrine ROS    Renal/GU negative Renal ROS     Musculoskeletal negative musculoskeletal ROS (+)    Abdominal   Peds  Hematology negative hematology ROS (+)   Anesthesia Other Findings   Reproductive/Obstetrics                             Anesthesia Physical Anesthesia Plan  ASA: 2  Anesthesia Plan: General   Post-op Pain Management:    Induction: Intravenous  PONV Risk Score and Plan: 4 or greater and Ondansetron, Dexamethasone, Midazolam and Scopolamine patch - Pre-op  Airway Management Planned: LMA  Additional Equipment: None  Intra-op Plan:   Post-operative Plan: Extubation in OR  Informed Consent: I have reviewed the patients History and Physical, chart, labs and discussed the procedure including the risks, benefits and alternatives for the proposed anesthesia with the patient or authorized representative who has indicated his/her understanding and acceptance.     Dental advisory given  Plan Discussed with: CRNA  Anesthesia Plan Comments:        Anesthesia Quick Evaluation

## 2022-03-15 ENCOUNTER — Ambulatory Visit (HOSPITAL_BASED_OUTPATIENT_CLINIC_OR_DEPARTMENT_OTHER): Payer: BC Managed Care – PPO | Admitting: Anesthesiology

## 2022-03-15 ENCOUNTER — Other Ambulatory Visit: Payer: Self-pay

## 2022-03-15 ENCOUNTER — Encounter (HOSPITAL_BASED_OUTPATIENT_CLINIC_OR_DEPARTMENT_OTHER): Payer: Self-pay | Admitting: General Surgery

## 2022-03-15 ENCOUNTER — Encounter (HOSPITAL_BASED_OUTPATIENT_CLINIC_OR_DEPARTMENT_OTHER)
Admission: RE | Disposition: A | Discharge status: Home / Self Care | Payer: Self-pay | Source: Home / Self Care | Attending: General Surgery

## 2022-03-15 ENCOUNTER — Ambulatory Visit (HOSPITAL_BASED_OUTPATIENT_CLINIC_OR_DEPARTMENT_OTHER)
Admission: RE | Admit: 2022-03-15 | Discharge: 2022-03-15 | Disposition: A | Discharge status: Home / Self Care | Payer: BC Managed Care – PPO | Attending: General Surgery | Admitting: General Surgery

## 2022-03-15 DIAGNOSIS — D242 Benign neoplasm of left breast: Secondary | ICD-10-CM | POA: Diagnosis not present

## 2022-03-15 DIAGNOSIS — N6082 Other benign mammary dysplasias of left breast: Secondary | ICD-10-CM | POA: Insufficient documentation

## 2022-03-15 DIAGNOSIS — R928 Other abnormal and inconclusive findings on diagnostic imaging of breast: Secondary | ICD-10-CM | POA: Diagnosis not present

## 2022-03-15 DIAGNOSIS — N6092 Unspecified benign mammary dysplasia of left breast: Secondary | ICD-10-CM | POA: Diagnosis not present

## 2022-03-15 HISTORY — DX: Elevated blood-pressure reading, without diagnosis of hypertension: R03.0

## 2022-03-15 HISTORY — PX: BREAST LUMPECTOMY WITH RADIOACTIVE SEED LOCALIZATION: SHX6424

## 2022-03-15 HISTORY — DX: Unspecified benign mammary dysplasia of unspecified breast: N60.99

## 2022-03-15 HISTORY — DX: Unspecified asthma, uncomplicated: J45.909

## 2022-03-15 HISTORY — DX: Other seasonal allergic rhinitis: J30.2

## 2022-03-15 SURGERY — BREAST LUMPECTOMY WITH RADIOACTIVE SEED LOCALIZATION
Anesthesia: General | Site: Breast | Laterality: Left

## 2022-03-15 MED ORDER — FENTANYL CITRATE (PF) 100 MCG/2ML IJ SOLN: 25.0000 ug | INTRAMUSCULAR | Status: DC | PRN

## 2022-03-15 MED ORDER — ONDANSETRON HCL 4 MG/2ML IJ SOLN
INTRAMUSCULAR | Status: AC
  Filled 2022-03-15: qty 2

## 2022-03-15 MED ORDER — OXYCODONE HCL 5 MG/5ML PO SOLN: 5.0000 mg | Freq: Once | ORAL | Status: DC | PRN

## 2022-03-15 MED ORDER — FENTANYL CITRATE (PF) 100 MCG/2ML IJ SOLN
INTRAMUSCULAR | Status: AC
  Filled 2022-03-15: qty 2

## 2022-03-15 MED ORDER — PHENYLEPHRINE HCL (PRESSORS) 10 MG/ML IV SOLN
INTRAVENOUS | Status: DC | PRN
  Administered 2022-03-15 (×2): 80 ug via INTRAVENOUS
  Administered 2022-03-15 (×2): 160 ug via INTRAVENOUS

## 2022-03-15 MED ORDER — MIDAZOLAM HCL 2 MG/2ML IJ SOLN
INTRAMUSCULAR | Status: AC
  Filled 2022-03-15: qty 2

## 2022-03-15 MED ORDER — LACTATED RINGERS IV SOLN: INTRAVENOUS | Status: DC

## 2022-03-15 MED ORDER — EPHEDRINE SULFATE (PRESSORS) 50 MG/ML IJ SOLN
INTRAMUSCULAR | Status: DC | PRN
  Administered 2022-03-15: 10 mg via INTRAVENOUS

## 2022-03-15 MED ORDER — SCOPOLAMINE 1 MG/3DAYS TD PT72
1.0000 | MEDICATED_PATCH | TRANSDERMAL | Status: DC
  Administered 2022-03-15: 1.5 mg via TRANSDERMAL

## 2022-03-15 MED ORDER — BUPIVACAINE-EPINEPHRINE (PF) 0.25% -1:200000 IJ SOLN
INTRAMUSCULAR | Status: AC
  Filled 2022-03-15: qty 30

## 2022-03-15 MED ORDER — VANCOMYCIN HCL IN DEXTROSE 1-5 GM/200ML-% IV SOLN
INTRAVENOUS | Status: AC
  Filled 2022-03-15: qty 200

## 2022-03-15 MED ORDER — LIDOCAINE HCL (CARDIAC) PF 100 MG/5ML IV SOSY
PREFILLED_SYRINGE | INTRAVENOUS | Status: DC | PRN
  Administered 2022-03-15: 50 mg via INTRAVENOUS

## 2022-03-15 MED ORDER — ONDANSETRON HCL 4 MG/2ML IJ SOLN
INTRAMUSCULAR | Status: DC | PRN
  Administered 2022-03-15: 4 mg via INTRAVENOUS

## 2022-03-15 MED ORDER — PROPOFOL 10 MG/ML IV BOLUS
INTRAVENOUS | Status: DC | PRN
  Administered 2022-03-15: 180 mg via INTRAVENOUS

## 2022-03-15 MED ORDER — AMISULPRIDE (ANTIEMETIC) 5 MG/2ML IV SOLN: 10.0000 mg | Freq: Once | INTRAVENOUS | Status: DC | PRN

## 2022-03-15 MED ORDER — ACETAMINOPHEN 160 MG/5ML PO SOLN: 325.0000 mg | ORAL | Status: DC | PRN

## 2022-03-15 MED ORDER — 0.9 % SODIUM CHLORIDE (POUR BTL) OPTIME
TOPICAL | Status: DC | PRN
  Administered 2022-03-15: 120 mL

## 2022-03-15 MED ORDER — VANCOMYCIN HCL IN DEXTROSE 1-5 GM/200ML-% IV SOLN
1000.0000 mg | INTRAVENOUS | Status: AC
  Administered 2022-03-15: 1000 mg via INTRAVENOUS

## 2022-03-15 MED ORDER — ACETAMINOPHEN 10 MG/ML IV SOLN: 1000.0000 mg | Freq: Once | INTRAVENOUS | Status: DC | PRN

## 2022-03-15 MED ORDER — GABAPENTIN 300 MG PO CAPS
300.0000 mg | ORAL_CAPSULE | ORAL | Status: AC
  Administered 2022-03-15: 300 mg via ORAL

## 2022-03-15 MED ORDER — PHENYLEPHRINE 80 MCG/ML (10ML) SYRINGE FOR IV PUSH (FOR BLOOD PRESSURE SUPPORT)
PREFILLED_SYRINGE | INTRAVENOUS | Status: AC
  Filled 2022-03-15: qty 10

## 2022-03-15 MED ORDER — CELECOXIB 200 MG PO CAPS
200.0000 mg | ORAL_CAPSULE | ORAL | Status: AC
  Administered 2022-03-15: 200 mg via ORAL

## 2022-03-15 MED ORDER — GABAPENTIN 300 MG PO CAPS
ORAL_CAPSULE | ORAL | Status: AC
  Filled 2022-03-15: qty 1

## 2022-03-15 MED ORDER — FENTANYL CITRATE (PF) 100 MCG/2ML IJ SOLN
INTRAMUSCULAR | Status: DC | PRN
  Administered 2022-03-15: 50 ug via INTRAVENOUS

## 2022-03-15 MED ORDER — DEXAMETHASONE SODIUM PHOSPHATE 4 MG/ML IJ SOLN
INTRAMUSCULAR | Status: DC | PRN
  Administered 2022-03-15: 5 mg via INTRAVENOUS

## 2022-03-15 MED ORDER — BUPIVACAINE-EPINEPHRINE (PF) 0.25% -1:200000 IJ SOLN
INTRAMUSCULAR | Status: DC | PRN
  Administered 2022-03-15: 20 mL

## 2022-03-15 MED ORDER — OXYCODONE HCL 5 MG PO TABS: 5.0000 mg | ORAL_TABLET | Freq: Once | ORAL | Status: DC | PRN

## 2022-03-15 MED ORDER — OXYCODONE HCL 5 MG PO TABS: 5.0000 mg | ORAL_TABLET | Freq: Four times a day (QID) | ORAL | 0 refills | Status: DC | PRN

## 2022-03-15 MED ORDER — SCOPOLAMINE 1 MG/3DAYS TD PT72
MEDICATED_PATCH | TRANSDERMAL | Status: AC
  Filled 2022-03-15: qty 1

## 2022-03-15 MED ORDER — ACETAMINOPHEN 500 MG PO TABS
1000.0000 mg | ORAL_TABLET | ORAL | Status: AC
  Administered 2022-03-15: 1000 mg via ORAL

## 2022-03-15 MED ORDER — ACETAMINOPHEN 500 MG PO TABS
ORAL_TABLET | ORAL | Status: AC
  Filled 2022-03-15: qty 2

## 2022-03-15 MED ORDER — CELECOXIB 200 MG PO CAPS
ORAL_CAPSULE | ORAL | Status: AC
  Filled 2022-03-15: qty 1

## 2022-03-15 MED ORDER — ACETAMINOPHEN 325 MG PO TABS: 325.0000 mg | ORAL_TABLET | ORAL | Status: DC | PRN

## 2022-03-15 MED ORDER — LIDOCAINE 2% (20 MG/ML) 5 ML SYRINGE
INTRAMUSCULAR | Status: AC
  Filled 2022-03-15: qty 5

## 2022-03-15 MED ORDER — DEXAMETHASONE SODIUM PHOSPHATE 10 MG/ML IJ SOLN
INTRAMUSCULAR | Status: AC
  Filled 2022-03-15: qty 1

## 2022-03-15 MED ORDER — MIDAZOLAM HCL 5 MG/5ML IJ SOLN
INTRAMUSCULAR | Status: DC | PRN
  Administered 2022-03-15: 2 mg via INTRAVENOUS

## 2022-03-15 MED ORDER — PROPOFOL 10 MG/ML IV BOLUS
INTRAVENOUS | Status: AC
  Filled 2022-03-15: qty 20

## 2022-03-15 MED ORDER — PROMETHAZINE HCL 25 MG/ML IJ SOLN: 6.2500 mg | INTRAMUSCULAR | Status: DC | PRN

## 2022-03-15 SURGICAL SUPPLY — 45 items
ADH SKN CLS APL DERMABOND .7 (GAUZE/BANDAGES/DRESSINGS) ×1
APL PRP STRL LF DISP 70% ISPRP (MISCELLANEOUS) ×1
APPLIER CLIP 9.375 MED OPEN (MISCELLANEOUS)
APR CLP MED 9.3 20 MLT OPN (MISCELLANEOUS)
BLADE SURG 15 STRL LF DISP TIS (BLADE) ×1 IMPLANT
BLADE SURG 15 STRL SS (BLADE) ×1
CANISTER SUC SOCK COL 7IN (MISCELLANEOUS) ×1 IMPLANT
CANISTER SUCT 1200ML W/VALVE (MISCELLANEOUS) ×1 IMPLANT
CHLORAPREP W/TINT 26 (MISCELLANEOUS) ×1 IMPLANT
CLIP APPLIE 9.375 MED OPEN (MISCELLANEOUS) IMPLANT
COVER BACK TABLE 60X90IN (DRAPES) ×1 IMPLANT
COVER MAYO STAND STRL (DRAPES) ×1 IMPLANT
COVER PROBE W GEL 5X96 (DRAPES) ×1 IMPLANT
DERMABOND ADVANCED .7 DNX12 (GAUZE/BANDAGES/DRESSINGS) ×1 IMPLANT
DRAPE LAPAROSCOPIC ABDOMINAL (DRAPES) ×1 IMPLANT
DRAPE UTILITY XL STRL (DRAPES) ×1 IMPLANT
ELECT COATED BLADE 2.86 ST (ELECTRODE) ×1 IMPLANT
ELECT REM PT RETURN 9FT ADLT (ELECTROSURGICAL) ×1
ELECTRODE REM PT RTRN 9FT ADLT (ELECTROSURGICAL) ×1 IMPLANT
GLOVE BIO SURGEON STRL SZ7.5 (GLOVE) ×2 IMPLANT
GLOVE BIO SURGEON STRL SZ8 (GLOVE) IMPLANT
GLOVE BIOGEL PI IND STRL 7.0 (GLOVE) IMPLANT
GOWN STRL REUS W/ TWL LRG LVL3 (GOWN DISPOSABLE) ×2 IMPLANT
GOWN STRL REUS W/ TWL XL LVL3 (GOWN DISPOSABLE) IMPLANT
GOWN STRL REUS W/TWL LRG LVL3 (GOWN DISPOSABLE) ×1
GOWN STRL REUS W/TWL XL LVL3 (GOWN DISPOSABLE) ×1
ILLUMINATOR WAVEGUIDE N/F (MISCELLANEOUS) IMPLANT
KIT MARKER MARGIN INK (KITS) ×1 IMPLANT
LIGHT WAVEGUIDE WIDE FLAT (MISCELLANEOUS) IMPLANT
NDL HYPO 25X1 1.5 SAFETY (NEEDLE) IMPLANT
NEEDLE HYPO 25X1 1.5 SAFETY (NEEDLE) ×1 IMPLANT
NS IRRIG 1000ML POUR BTL (IV SOLUTION) IMPLANT
PACK BASIN DAY SURGERY FS (CUSTOM PROCEDURE TRAY) ×1 IMPLANT
PENCIL SMOKE EVACUATOR (MISCELLANEOUS) ×1 IMPLANT
SLEEVE SCD COMPRESS KNEE MED (STOCKING) ×1 IMPLANT
SPIKE FLUID TRANSFER (MISCELLANEOUS) IMPLANT
SPONGE T-LAP 18X18 ~~LOC~~+RFID (SPONGE) ×1 IMPLANT
SUT MON AB 4-0 PC3 18 (SUTURE) ×1 IMPLANT
SUT SILK 2 0 SH (SUTURE) IMPLANT
SUT VICRYL 3-0 CR8 SH (SUTURE) ×1 IMPLANT
SYR CONTROL 10ML LL (SYRINGE) IMPLANT
TOWEL GREEN STERILE FF (TOWEL DISPOSABLE) ×1 IMPLANT
TRAY FAXITRON CT DISP (TRAY / TRAY PROCEDURE) ×1 IMPLANT
TUBE CONNECTING 20X1/4 (TUBING) ×1 IMPLANT
YANKAUER SUCT BULB TIP NO VENT (SUCTIONS) IMPLANT

## 2022-03-15 NOTE — Anesthesia Procedure Notes (Signed)
Procedure Name: LMA Insertion Date/Time: 03/15/2022 8:14 AM  Performed by: Bufford Spikes, CRNAPre-anesthesia Checklist: Patient identified, Emergency Drugs available, Suction available and Patient being monitored Patient Re-evaluated:Patient Re-evaluated prior to induction Oxygen Delivery Method: Circle system utilized Preoxygenation: Pre-oxygenation with 100% oxygen Induction Type: IV induction Ventilation: Mask ventilation without difficulty LMA: LMA inserted LMA Size: 4.0 Number of attempts: 1 Placement Confirmation: positive ETCO2 Tube secured with: Tape Dental Injury: Teeth and Oropharynx as per pre-operative assessment

## 2022-03-15 NOTE — Transfer of Care (Signed)
Immediate Anesthesia Transfer of Care Note  Patient: Vanessa Gordon  Procedure(s) Performed: LEFT BREAST LUMPECTOMY WITH RADIOACTIVE SEED LOCALIZATION (Left: Breast)  Patient Location: PACU  Anesthesia Type:General  Level of Consciousness: awake, alert , and oriented  Airway & Oxygen Therapy: Patient Spontanous Breathing and Patient connected to face mask oxygen  Post-op Assessment: Report given to RN and Post -op Vital signs reviewed and stable  Post vital signs: Reviewed and stable  Last Vitals:  Vitals Value Taken Time  BP 131/90 03/15/22 0901  Temp    Pulse 59 03/15/22 0903  Resp 14 03/15/22 0903  SpO2 100 % 03/15/22 0903  Vitals shown include unvalidated device data.  Last Pain:  Vitals:   03/15/22 0726  TempSrc: Oral  PainSc: 0-No pain      Patients Stated Pain Goal: 3 (18/48/59 2763)  Complications: No notable events documented.

## 2022-03-15 NOTE — H&P (Signed)
REFERRING PHYSICIAN: Reece Packer, MD  PROVIDER: Landry Corporal, MD  MRN: [id number] DOB: Apr 03, 1961 Subjective  Chief Complaint: New Consultation (Left Breast )   History of Present Illness: Vanessa Gordon is a 61 y.o. female who is seen today as an office consultation for evaluation of New Consultation (Left Breast ) .  We are asked to see the patient in consultation by Dr. Karie Fetch to evaluate her for atypical lobular hyperplasia of the left breast. The patient is a 61 year old black female who recently went for a routine screening mammogram. At that time she was found to have a new area of calcification in the upper outer quadrant of the left breast measuring 1.6 cm. This was biopsied and came back as atypical lobular hyperplasia. The rest of the calcifications were benign appearing and stable. She has no family history of breast cancer. She is otherwise in pretty good health and does not smoke.  Review of Systems: A complete review of systems was obtained from the patient. I have reviewed this information and discussed as appropriate with the patient. See HPI as well for other ROS.  ROS  Medical History: Past Medical History: Diagnosis Date Arthritis Asthma, unspecified asthma severity, unspecified whether complicated, unspecified whether persistent  Patient Active Problem List Diagnosis Atypical lobular hyperplasia of left breast  Past Surgical History: Procedure Laterality Date CESAREAN SECTION 11/19/1986 CHOLECYSTECTOMY 06/20/1996   Allergies Allergen Reactions Penicillins Hives Has patient had a PCN reaction causing immediate rash, facial/tongue/throat swelling, SOB or lightheadedness with hypotension: Yes Has patient had a PCN reaction causing severe rash involving mucus membranes or skin necrosis: No Has patient had a PCN reaction that required hospitalization No Has patient had a PCN reaction occurring within the last 10 years: No If all of the above  answers are "NO", then may proceed with Cephalosporin use. Venom-Honey Bee Other (See Comments)  Current Outpatient Medications on File Prior to Visit Medication Sig Dispense Refill predniSONE (DELTASONE) 20 MG tablet 3 TABS PO QD FOR 3 DAYS, 2 TABS PO QD FOR 3 DAYS, 1 TAB PO QD FOR 3 DAYS cetirizine (ZYRTEC) 10 MG tablet Take 10 mg by mouth once daily  No current facility-administered medications on file prior to visit.  History reviewed. No pertinent family history.  Social History  Tobacco Use Smoking Status Never Smokeless Tobacco Never   Social History  Socioeconomic History Marital status: Single Tobacco Use Smoking status: Never Smokeless tobacco: Never Substance and Sexual Activity Alcohol use: Yes Drug use: Never  Objective:  Vitals: BP: (!) 180/70 Pulse: (!) 129 Temp: 36.5 C (97.7 F) SpO2: 98% Weight: 80.8 kg (178 lb 3.2 oz) Height: 172.7 cm ('5\' 8"'$ )  Body mass index is 27.1 kg/m.  Physical Exam Vitals reviewed. Constitutional: General: She is not in acute distress. Appearance: Normal appearance. HENT: Head: Normocephalic and atraumatic. Right Ear: External ear normal. Left Ear: External ear normal. Nose: Nose normal. Mouth/Throat: Mouth: Mucous membranes are moist. Pharynx: Oropharynx is clear. Eyes: General: No scleral icterus. Extraocular Movements: Extraocular movements intact. Conjunctiva/sclera: Conjunctivae normal. Pupils: Pupils are equal, round, and reactive to light. Cardiovascular: Rate and Rhythm: Normal rate and regular rhythm. Pulses: Normal pulses. Heart sounds: Normal heart sounds. Pulmonary: Effort: Pulmonary effort is normal. No respiratory distress. Breath sounds: Normal breath sounds. Abdominal: General: Bowel sounds are normal. Palpations: Abdomen is soft. Tenderness: There is no abdominal tenderness. Musculoskeletal: General: No swelling, tenderness or deformity. Normal range of motion. Cervical back: Normal  range of motion and neck supple. Skin: General:  Skin is warm and dry. Coloration: Skin is not jaundiced. Neurological: General: No focal deficit present. Mental Status: She is alert and oriented to person, place, and time. Psychiatric: Mood and Affect: Mood normal. Behavior: Behavior normal.   Breast: There is no palpable mass in either breast. There is no palpable axillary, supraclavicular, or cervical lymphadenopathy.  Labs, Imaging and Diagnostic Testing:  Assessment and Plan:  Diagnoses and all orders for this visit:  Atypical lobular hyperplasia of left breast - Ambulatory Referral to Oncology-Medical - [facility] Case Posting Request; Future    The patient appears to have a 1.6 cm area of atypical lobular hyperplasia in the upper outer quadrant of the left breast. Because this is considered a high risk lesion and can increase her risk of breast cancer to around 20 to 30% and because it can have an appearance similar to preinvasive breast cancer my recommendation would be to have this area removed. She would also like to have this done. I have discussed with her in detail the risks and benefits of the operation as well as some of the technical aspects including the use of a radioactive seed for localization and she understands and wishes to proceed. I will also refer her to the high risk clinic at the cancer center to talk about risk reduction

## 2022-03-15 NOTE — Discharge Instructions (Addendum)
  Post Anesthesia Home Care Instructions  Activity: Get plenty of rest for the remainder of the day. A responsible individual must stay with you for 24 hours following the procedure.  For the next 24 hours, DO NOT: -Drive a car -Paediatric nurse -Drink alcoholic beverages -Take any medication unless instructed by your physician -Make any legal decisions or sign important papers.  Meals: Start with liquid foods such as gelatin or soup. Progress to regular foods as tolerated. Avoid greasy, spicy, heavy foods. If nausea and/or vomiting occur, drink only clear liquids until the nausea and/or vomiting subsides. Call your physician if vomiting continues.  Special Instructions/Symptoms: Your throat may feel dry or sore from the anesthesia or the breathing tube placed in your throat during surgery. If this causes discomfort, gargle with warm salt water. The discomfort should disappear within 24 hours.  If you had a scopolamine patch placed behind your ear for the management of post- operative nausea and/or vomiting:  1. The medication in the patch is effective for 72 hours, after which it should be removed.  Wrap patch in a tissue and discard in the trash. Wash hands thoroughly with soap and water. 2. You may remove the patch earlier than 72 hours if you experience unpleasant side effects which may include dry mouth, dizziness or visual disturbances. 3. Avoid touching the patch. Wash your hands with soap and water after contact with the patch.     Next dose of Tylenol can be given at 1:30pm if needed. Next dose of NSAID (Ibuprofen/Motrin/Aleve) can be given at 3:30pm if needed.

## 2022-03-15 NOTE — Interval H&P Note (Signed)
History and Physical Interval Note:  03/15/2022 7:52 AM  Vanessa Gordon  has presented today for surgery, with the diagnosis of LEFT BREAS ALH.  The various methods of treatment have been discussed with the patient and family. After consideration of risks, benefits and other options for treatment, the patient has consented to  Procedure(s): LEFT BREAST LUMPECTOMY WITH RADIOACTIVE SEED LOCALIZATION (Left) as a surgical intervention.  The patient's history has been reviewed, patient examined, no change in status, stable for surgery.  I have reviewed the patient's chart and labs.  Questions were answered to the patient's satisfaction.     Autumn Messing III

## 2022-03-15 NOTE — Anesthesia Postprocedure Evaluation (Signed)
Anesthesia Post Note  Patient: Vanessa Gordon  Procedure(s) Performed: LEFT BREAST LUMPECTOMY WITH RADIOACTIVE SEED LOCALIZATION (Left: Breast)     Patient location during evaluation: PACU Anesthesia Type: General Level of consciousness: awake and alert Pain management: pain level controlled Vital Signs Assessment: post-procedure vital signs reviewed and stable Respiratory status: spontaneous breathing, nonlabored ventilation, respiratory function stable and patient connected to nasal cannula oxygen Cardiovascular status: blood pressure returned to baseline and stable Postop Assessment: no apparent nausea or vomiting Anesthetic complications: no   No notable events documented.  Last Vitals:  Vitals:   03/15/22 0940 03/15/22 0959  BP: 130/80 130/80  Pulse: 68 68  Resp: 18 18  Temp: (!) 36.3 C (!) 36.3 C  SpO2: 97% 97%    Last Pain:  Vitals:   03/15/22 0959  TempSrc: Oral  PainSc: 0-No pain                 Effie Berkshire

## 2022-03-15 NOTE — Op Note (Signed)
03/15/2022  8:55 AM  PATIENT:  Vanessa Gordon  61 y.o. female  PRE-OPERATIVE DIAGNOSIS:  LEFT BREAS ALH  POST-OPERATIVE DIAGNOSIS:  LEFT BREAS ALH  PROCEDURE:  Procedure(s): LEFT BREAST LUMPECTOMY WITH RADIOACTIVE SEED LOCALIZATION (Left)  SURGEON:  Surgeon(s) and Role:    * Jovita Kussmaul, MD - Primary  PHYSICIAN ASSISTANT:   ASSISTANTS: none   ANESTHESIA:   local and general  EBL:  5 mL   BLOOD ADMINISTERED:none  DRAINS: none   LOCAL MEDICATIONS USED:  MARCAINE     SPECIMEN:  Source of Specimen:  left breast tissue  DISPOSITION OF SPECIMEN:  PATHOLOGY  COUNTS:  YES  TOURNIQUET:  * No tourniquets in log *  DICTATION: .Dragon Dictation  After informed consent was obtained the patient was brought to the operating room and placed in the supine position on the operating table.  After adequate induction of general anesthesia the patient's left breast was prepped with ChloraPrep, allowed to dry, and draped in usual sterile manner.  An appropriate timeout was performed.  Previously an I-125 seed was placed in the upper outer left breast to mark an area of atypical lobular hyperplasia.  The neoprobe was set to I-125 in the area of radioactivity was readily identified.  The area around this was infiltrated with quarter percent Marcaine.  A curvilinear incision was made with a 15 blade knife along the upper outer edge of the areola of the left breast.  The incision was carried through the skin and subcutaneous tissue sharply with the electrocautery.  Dissection was then carried towards the radioactive seed under the direction of the neoprobe.  Once I more closely approached the radioactive seed I then removed the circular portion of breast tissue sharply with the electrocautery around the radioactive seed while checking the area of radioactivity frequently.  Once the specimen was removed it was oriented with the appropriate paint colors.  A specimen radiograph was obtained that  showed the clip and seed to be within the specimen.  The specimen was then sent to pathology for further evaluation.  Hemostasis was achieved using the Bovie electrocautery.  The wound was irrigated with saline and infiltrated with more quarter percent Marcaine.  The deep layer of the incision was then closed with layers of interrupted 3-0 Vicryl stitches.  The skin was then closed with interrupted 4-0 Monocryl subcuticular stitches.  Dermabond dressings were applied.  The patient tolerated the procedure well.  At the end of the case all needle sponge and instrument counts were correct.  The patient was then awakened and taken to recovery in stable condition.  PLAN OF CARE: Discharge to home after PACU  PATIENT DISPOSITION:  PACU - hemodynamically stable.   Delay start of Pharmacological VTE agent (>24hrs) due to surgical blood loss or risk of bleeding: not applicable

## 2022-03-16 ENCOUNTER — Encounter (HOSPITAL_BASED_OUTPATIENT_CLINIC_OR_DEPARTMENT_OTHER): Payer: Self-pay | Admitting: General Surgery

## 2022-03-19 LAB — SURGICAL PATHOLOGY

## 2022-03-26 ENCOUNTER — Inpatient Hospital Stay: Payer: BC Managed Care – PPO | Attending: Hematology and Oncology | Admitting: Hematology and Oncology

## 2022-03-26 DIAGNOSIS — N6092 Unspecified benign mammary dysplasia of left breast: Secondary | ICD-10-CM | POA: Diagnosis not present

## 2022-03-26 NOTE — Progress Notes (Signed)
[facility] CONSULT NOTE  Patient Care Team: Long, Nicki Reaper, PA-C as PCP - General (Physician Assistant)  CHIEF COMPLAINTS/PURPOSE OF CONSULTATION:  At high risk for breast cancer  ASSESSMENT & PLAN:   This is a very pleasant 61 yr old AAF with [place], scheduled for lumpectomy referred to high risk breast clinic for additional recommendations.  Since last visit, she denies any new complaints.  She was wondering about pathology as well as tamoxifen.  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  We once again discussed about the role of tamoxifen and prevention of breast cancer.  I have reviewed adverse effects including but not limited to DVT/PE, endometrial hyperplasia and rarely endometrial cancer.  A benefit from tamoxifen in addition to risk reduction of breast cancer would be improvement in bone density.  She wants to think about it.  She was wondering if she can talk to any patients who are on tamoxifen to understand it better.  Have sent a message to our nurse navigator team to see if we can get the patient wanted to talk to her.  She will otherwise call me with her final decision.  She is not quite sure about the MRI as well.  She will continue mammograms at this time. Return to clinic in 1 year or sooner as needed  HISTORY OF PRESENTING ILLNESS:  Vanessa Gordon 61 y.o. female is here because of [place]  This is a very pleasant 61 year old female patient with atypical lobular hyperplasia, scheduled for lumpectomy referred to high risk breast clinic for further recommendations.  She is here for telephone visit to review final pathology and to discuss additional recommendations.  She is healing very well, no complications after surgery. REVIEW OF SYSTEMS:   Constitutional: Denies fevers, chills or abnormal night sweats Eyes: Denies blurriness of vision, double vision or watery eyes Ears, nose,  mouth, throat, and face: Denies mucositis or sore throat Respiratory: Denies cough, dyspnea or wheezes Cardiovascular: Denies palpitation, chest discomfort or lower extremity swelling Gastrointestinal:  Denies nausea, heartburn or change in bowel habits Skin: Denies abnormal skin rashes Lymphatics: Denies new lymphadenopathy or easy bruising Neurological:Denies numbness, tingling or new weaknesses Behavioral/Psych: Mood is stable, no new changes  All other systems were reviewed with the patient and are negative.  MEDICAL HISTORY:  Past Medical History:  Diagnosis Date   Acid reflux    Asthma    Atypical hyperplasia of breast    Borderline hypertension    Seasonal allergies     SURGICAL HISTORY: Past Surgical History:  Procedure Laterality Date   BREAST LUMPECTOMY WITH RADIOACTIVE SEED LOCALIZATION Left 03/15/2022   Procedure: LEFT BREAST LUMPECTOMY WITH RADIOACTIVE SEED LOCALIZATION;  Surgeon: Jovita Kussmaul, MD;  Location: [facility];  Service: General;  Laterality: Left;   CESAREAN SECTION  1988   CHOLECYSTECTOMY      SOCIAL HISTORY: Social History   Socioeconomic History   Marital status: Single    Spouse name: Not on file   Number of children: 1   Years of education: Not on file   Highest education level: Not on file  Occupational History   Not on file  Tobacco Use   Smoking status: Never   Smokeless tobacco: Never  Vaping Use   Vaping Use: Never used  Substance and Sexual Activity   Alcohol use: Yes    Comment: Occasionally    Drug use: No   Sexual activity:  Not on file  Other Topics Concern   Not on file  Social History Narrative   Not on file   Social Determinants of Health   Financial Resource Strain: Not on file  Food Insecurity: Not on file  Transportation Needs: Not on file  Physical Activity: Not on file  Stress: Not on file  Social Connections: Not on file  Intimate Partner Violence: Not on file    FAMILY HISTORY: Family  History  Problem Relation Age of Onset   Heart disease Mother    Hypertension Mother    Asthma Mother    Heart attack Father    Hypertension Brother    Renal Disease Brother     ALLERGIES:  is allergic to honey bee venom protein [honey bee venom] and penicillins.  MEDICATIONS:  Current Outpatient Medications  Medication Sig Dispense Refill   albuterol (VENTOLIN HFA) 108 (90 Base) MCG/ACT inhaler Inhale 1-2 puffs into the lungs every 4 (four) hours as needed. 6.7 g 6   amLODipine (NORVASC) 2.5 MG tablet Take 2.5 mg by mouth daily.     azelastine (ASTELIN) 0.1 % nasal spray daily at 6 (six) AM.     Black Cohosh 40 MG CAPS Take 40 mg by mouth daily.     Budeson-Glycopyrrol-Formoterol (BREZTRI AEROSPHERE) 160-9-4.8 MCG/ACT AERO Inhale 2 puffs into the lungs in the morning and at bedtime. 1 each 3   cetirizine (ZYRTEC) 10 MG tablet Take 10 mg by mouth daily.     EPINEPHrine 0.3 mg/0.3 mL IJ SOAJ injection See admin instructions.     famotidine (PEPCID) 20 MG tablet Take 1 tablet by mouth daily at 6 (six) AM.     Fluticasone-Umeclidin-Vilant (TRELEGY ELLIPTA) 200-62.5-25 MCG/ACT AEPB Inhale 1 puff into the lungs daily. 1 each 3   folic acid (FOLVITE) 1 MG tablet Take 1 mg by mouth daily.   11   montelukast (SINGULAIR) 10 MG tablet TAKE 1 TABLET(10 MG) BY MOUTH AT BEDTIME 30 tablet 11   Multiple Vitamin (MULTIVITAMIN) tablet Take 1 tablet by mouth daily.     multivitamin-lutein (OCUVITE-LUTEIN) CAPS capsule Take 1 capsule by mouth daily.     oxyCODONE (ROXICODONE) 5 MG immediate release tablet Take 1 tablet (5 mg total) by mouth every 6 (six) hours as needed for severe pain. 10 tablet 0   No current facility-administered medications for this visit.     PHYSICAL EXAMINATION: ECOG PERFORMANCE STATUS: 0 - Asymptomatic  There were no vitals filed for this visit.  There were no vitals filed for this visit.   PE not done, telephone visit.  LABORATORY DATA:  I have reviewed the data as  listed Lab Results  Component Value Date   WBC 10.4 08/23/2020   HGB 13.8 08/23/2020   HCT 42.4 08/23/2020   MCV 91.6 08/23/2020   PLT 456 (H) 08/23/2020     Chemistry      Component Value Date/Time   NA 137 08/23/2020 1724   K 4.7 08/23/2020 1724   CL 105 08/23/2020 1724   CO2 25 08/23/2020 1724   BUN 15 08/23/2020 1724   CREATININE 0.94 08/23/2020 1724      Component Value Date/Time   CALCIUM 8.8 (L) 08/23/2020 1724   ALKPHOS 105 08/23/2020 1724   AST 58 (H) 08/23/2020 1724   ALT 47 (H) 08/23/2020 1724   BILITOT 0.8 08/23/2020 1724       RADIOGRAPHIC STUDIES: I have personally reviewed the radiological images as listed and agreed with the findings in  the report. No results found.  All questions were answered. The patient knows to call the clinic with any problems, questions or concerns. I spent 15 minutes in the care of this patient including H and P, review of records, counseling and coordination of care.     Benay Pike, MD 03/26/2022 3:27 PM

## 2022-04-11 ENCOUNTER — Encounter (HOSPITAL_COMMUNITY): Payer: Self-pay

## 2022-04-15 ENCOUNTER — Other Ambulatory Visit: Payer: Self-pay | Admitting: Pulmonary Disease

## 2022-04-29 ENCOUNTER — Encounter: Payer: Self-pay | Admitting: Hematology and Oncology

## 2022-05-01 ENCOUNTER — Telehealth: Payer: Self-pay

## 2022-05-01 ENCOUNTER — Other Ambulatory Visit: Payer: Self-pay | Admitting: Hematology and Oncology

## 2022-05-01 MED ORDER — TAMOXIFEN CITRATE 10 MG PO TABS: 5.0000 mg | ORAL_TABLET | Freq: Every day | ORAL | 3 refills | Status: DC

## 2022-05-01 NOTE — Telephone Encounter (Signed)
Called pt to advise that Dr. Chryl Heck has sent in prescription for Tamoxifin 10 mg. She understands that she will need to split the 10 mg tablet in half for a 5 mg dose.

## 2022-05-01 NOTE — Progress Notes (Signed)
I sent tamoxifen. Dose is 5 mg daily.  Vanessa Gordon

## 2022-05-10 DIAGNOSIS — J019 Acute sinusitis, unspecified: Secondary | ICD-10-CM | POA: Diagnosis not present

## 2022-05-10 DIAGNOSIS — I10 Essential (primary) hypertension: Secondary | ICD-10-CM | POA: Diagnosis not present

## 2022-05-13 ENCOUNTER — Other Ambulatory Visit: Payer: Self-pay | Admitting: Pulmonary Disease

## 2022-05-14 ENCOUNTER — Encounter: Payer: Self-pay | Admitting: Pulmonary Disease

## 2022-05-15 NOTE — Telephone Encounter (Signed)
Both options do have savings cards that the patient may go onto the respective websites and fill out their forms to get co-pay cards if the patient is eligible.

## 2022-05-15 NOTE — Telephone Encounter (Signed)
[place] message sent by pt: [facility] (supporting Lanier Clam, MD)17 hours ago (4:52 PM)    Hi is there a discount card for [employer] or [employer] for 2024?  Let me know please, thanks.  [place]     Routing to prior [place] team to see if they know about this.

## 2022-05-31 ENCOUNTER — Encounter: Payer: Self-pay | Admitting: Pulmonary Disease

## 2022-05-31 MED ORDER — TRELEGY ELLIPTA 200-62.5-25 MCG/ACT IN AEPB: 1.0000 | INHALATION_SPRAY | Freq: Every day | RESPIRATORY_TRACT | 3 refills | Status: DC

## 2022-06-10 ENCOUNTER — Telehealth: Payer: Self-pay | Admitting: Pulmonary Disease

## 2022-06-10 NOTE — Telephone Encounter (Signed)
Called and spoke with pt. Provided pt the website from [place] message that had been sent to pt about where she could go to try to get savings for Trelegy. Nothing further needed.

## 2022-06-10 NOTE — Telephone Encounter (Signed)
Pt calling for Dr. Daryl Eastern nurse regarding her Trilogy. Would not elaborate for me. Pls call # on file.

## 2022-06-17 ENCOUNTER — Ambulatory Visit (INDEPENDENT_AMBULATORY_CARE_PROVIDER_SITE_OTHER): Payer: BC Managed Care – PPO | Admitting: Student

## 2022-06-17 ENCOUNTER — Encounter: Payer: Self-pay | Admitting: Student

## 2022-06-17 VITALS — BP 145/84 | HR 88 | Ht 68.0 in | Wt 181.6 lb

## 2022-06-17 DIAGNOSIS — K644 Residual hemorrhoidal skin tags: Secondary | ICD-10-CM | POA: Insufficient documentation

## 2022-06-17 DIAGNOSIS — K219 Gastro-esophageal reflux disease without esophagitis: Secondary | ICD-10-CM

## 2022-06-17 DIAGNOSIS — K5900 Constipation, unspecified: Secondary | ICD-10-CM

## 2022-06-17 DIAGNOSIS — Z0000 Encounter for general adult medical examination without abnormal findings: Secondary | ICD-10-CM | POA: Diagnosis not present

## 2022-06-17 DIAGNOSIS — Z23 Encounter for immunization: Secondary | ICD-10-CM

## 2022-06-17 DIAGNOSIS — M79675 Pain in left toe(s): Secondary | ICD-10-CM | POA: Diagnosis not present

## 2022-06-17 DIAGNOSIS — Z13220 Encounter for screening for lipoid disorders: Secondary | ICD-10-CM | POA: Diagnosis not present

## 2022-06-17 DIAGNOSIS — I10 Essential (primary) hypertension: Secondary | ICD-10-CM

## 2022-06-17 DIAGNOSIS — Z1211 Encounter for screening for malignant neoplasm of colon: Secondary | ICD-10-CM | POA: Insufficient documentation

## 2022-06-17 DIAGNOSIS — J309 Allergic rhinitis, unspecified: Secondary | ICD-10-CM | POA: Insufficient documentation

## 2022-06-17 HISTORY — DX: Constipation, unspecified: K59.00

## 2022-06-17 MED ORDER — AMLODIPINE BESYLATE 5 MG PO TABS: 5.0000 mg | ORAL_TABLET | Freq: Every day | ORAL | 3 refills | Status: DC

## 2022-06-17 MED ORDER — AMLODIPINE BESYLATE 5 MG PO TABS: 5.0000 mg | ORAL_TABLET | Freq: Every day | ORAL | 0 refills | Status: DC

## 2022-06-17 MED ORDER — OMEPRAZOLE 40 MG PO CPDR: 40.0000 mg | DELAYED_RELEASE_CAPSULE | Freq: Every day | ORAL | 0 refills | Status: DC

## 2022-06-17 NOTE — Patient Instructions (Signed)
It was great to see you today! Thank you for choosing [facility] for your primary care. Vanessa Gordon was seen for establishing care and concerns of toe pain and cough.  Today we addressed: Thank you for getting your flu vaccine. Toe pain: This should continue to heal well.  I would not advise an x-ray at this time. Cough: This could take a little while to go away or this could be related to your reflux.  Stop your Pepcid.  I have prescribed you omeprazole to take for 1 month.  This is not a medication that should be taken long-term without further discussion.  He should make an appointment in 1 month to rediscuss this. Your blood pressure is still elevated.  I recommend we increase the dosage of your medication. Thank you for sending the records release. When you have time, please create a list of your medications and have this sent to me so I can update your records appropriately.  If you haven't already, sign up for My Chart to have easy access to your labs results, and communication with your primary care physician.  We are checking some labs today. If they are abnormal, I will call you. If they are normal, I will send you a MyChart message (if it is active) or a letter in the mail. If you do not hear about your labs in the next 2 weeks, please call the office.  You should return to our clinic Return in about 4 weeks (around 07/15/2022) for HTN and cough follow-up. Please arrive 15 minutes before your appointment to ensure smooth check in process.  We appreciate your efforts in making this happen.  Thank you for allowing me to participate in your care, Wells Guiles, DO 06/17/2022, 4:15 PM PGY-2, [place]

## 2022-06-17 NOTE — Progress Notes (Unsigned)
Subjective:  Patient ID: Vanessa Gordon, female    DOB: 03/16/1961, 62 y.o.   MRN: [id number]  CC: New Patient  HPI:  Vanessa Gordon is a very pleasant 62 y.o. female who presents today to establish care.  Toe pain: Accidentally kicked one of her shoes in her closet 1.5 weeks. Hurt her 4th and 5th toes on her left foot, a bruise has remained.   Congestion: notes she had a cold a day after christmas. Still has a cough that won't go away and a feeling of postnasal drainage. The cough is the only thing that has lingered. She did have and uses Trelegy daily and albuterol prn. She has tried Dayquil and hydrocodeine cough syrup. Denies any improvement. The cough comes up "out of the blue". The cough will sometimes make her gag, does not make her vomit.   PMHx: Past Medical History:  Diagnosis Date   Acid reflux    Asthma    Atypical hyperplasia of breast    Borderline hypertension    Community acquired pneumonia 09/01/2020   Constipation 06/17/2022   Seasonal allergies   Lumpectomy: not cancerous however they did initiate the Tamoxifen.   Surgical Hx: Past Surgical History:  Procedure Laterality Date   BREAST LUMPECTOMY WITH RADIOACTIVE SEED LOCALIZATION Left 03/15/2022   Procedure: LEFT BREAST LUMPECTOMY WITH RADIOACTIVE SEED LOCALIZATION;  Surgeon: Jovita Kussmaul, MD;  Location: [facility];  Service: General;  Laterality: Left;   CESAREAN SECTION  1988   CHOLECYSTECTOMY      Family Hx: Family History  Problem Relation Age of Onset   Heart disease Mother    Hypertension Mother    Asthma Mother    Heart attack Father    Hypertension Brother    Renal Disease Brother     Social Hx: Current Social History   Who lives at home: *** 06/17/2022  Who would speak for you about health care matters: Fay Records (son) 06/17/2022  Transportation: *** 06/17/2022 Important Relationships & Pets: *** 06/17/2022  Current Stressors: *** 06/17/2022 Work / Education:  ***  06/17/2022 Religious / Personal Beliefs: *** 06/17/2022 Interests / Fun: *** 06/17/2022 Other: *** 06/17/2022   Medications:  Preventative Screening Colonoscopy: year*** results *** Mammogram: year*** results *** Pap test: year*** results *** PSA: year*** results *** DEXA: year*** results *** Tetanus vaccine: year*** results *** Pneumonia vaccine: year*** results *** Shingles vaccine: year*** results *** Heart stress test: year*** results *** Echocardiogram: year*** results *** Xrays: year*** results *** CT/MRI: year*** results ***  Smoking status reviewed  ROS: pertinent noted in the HPI   Objective:  BP (!) 160/77   Pulse 88   Ht '5\' 8"'$  (1.727 m)   Wt 181 lb 9.6 oz (82.4 kg)   SpO2 100%   BMI 27.61 kg/m  Vitals and nursing note reviewed  General: NAD, pleasant, able to participate in exam HEENT: normocephalic, TM's visualized bilaterally, no scleral icterus or conjunctival pallor, no nasal discharge, moist mucous membranes, good dentition without erythema or discharge noted in posterior oropharynx Neck: supple, non-tender, without lymphadenopathy Cardiac: RRR, S1 S2 present. normal heart sounds, no murmurs. Respiratory: CTAB, normal effort, No wheezes, rales or rhonchi Abdomen: Normoactive bowel sounds, non-tender, non-distended, no hepatosplenomegaly Extremities: no edema or cyanosis. Skin: warm and dry, no rashes noted Neuro: alert, no obvious focal deficits Psych: Normal affect and mood  Assessment & Plan:  There are no diagnoses linked to this encounter. No follow-ups on file. Wells Guiles, DO 06/17/2022, 3:30 PM PGY-***,  [place]

## 2022-06-17 NOTE — Assessment & Plan Note (Signed)
Suspect her subacute cough to be related to GERD.  She is not on an ACE inhibitor.  Discontinue Pepcid, start Omeprazole 40 mg daily.  Counseled on long-term risks of osteoporosis.  Follow-up in 1 month.

## 2022-06-18 ENCOUNTER — Encounter: Payer: Self-pay | Admitting: Student

## 2022-06-18 DIAGNOSIS — Z0000 Encounter for general adult medical examination without abnormal findings: Secondary | ICD-10-CM | POA: Insufficient documentation

## 2022-06-18 DIAGNOSIS — M79675 Pain in left toe(s): Secondary | ICD-10-CM | POA: Insufficient documentation

## 2022-06-18 DIAGNOSIS — N1831 Chronic kidney disease, stage 3a: Secondary | ICD-10-CM | POA: Insufficient documentation

## 2022-06-18 LAB — LIPID PANEL
Chol/HDL Ratio: 2.5 ratio (ref 0.0–4.4)
Cholesterol, Total: 191 mg/dL (ref 100–199)
HDL: 75 mg/dL (ref >39)
LDL Chol Calc (NIH): 97 mg/dL (ref 0–99)
Triglycerides: 109 mg/dL (ref 0–149)
VLDL Cholesterol Cal: 19 mg/dL (ref 5–40)

## 2022-06-18 LAB — COMPREHENSIVE METABOLIC PANEL
ALT: 16 IU/L (ref 0–32)
AST: 23 IU/L (ref 0–40)
Albumin/Globulin Ratio: 1.9 (ref 1.2–2.2)
Albumin: 4.5 g/dL (ref 3.9–4.9)
Alkaline Phosphatase: 98 IU/L (ref 44–121)
BUN/Creatinine Ratio: 11 — ABNORMAL LOW (ref 12–28)
BUN: 12 mg/dL (ref 8–27)
Bilirubin Total: <0.2 mg/dL (ref 0.0–1.2)
CO2: 25 mmol/L (ref 20–29)
Calcium: 9.5 mg/dL (ref 8.7–10.3)
Chloride: 103 mmol/L (ref 96–106)
Creatinine, Ser: 1.08 mg/dL — ABNORMAL HIGH (ref 0.57–1.00)
Globulin, Total: 2.4 g/dL (ref 1.5–4.5)
Glucose: 91 mg/dL (ref 70–99)
Potassium: 5 mmol/L (ref 3.5–5.2)
Sodium: 145 mmol/L — ABNORMAL HIGH (ref 134–144)
Total Protein: 6.9 g/dL (ref 6.0–8.5)
eGFR: 58 mL/min/{1.73_m2} — ABNORMAL LOW (ref >59)

## 2022-06-18 NOTE — Assessment & Plan Note (Signed)
Bruise versus fracture.  However range of motion appropriate and no difficulty with gait.  Would not recommend radiologic evaluation.

## 2022-06-18 NOTE — Assessment & Plan Note (Addendum)
Establish care today.  Notes colonoscopy 2022, mammogram 2023, Pap test 2021, tetanus shot in 2018.  I am unable to see these results.  Have requested release of information to be signed by patient.  Deferred hepatitis C and HIV screening at this time as she believes she has been tested for these in the past as well. Counseled on decreasing wine intake to 1 glass/day.

## 2022-06-18 NOTE — Assessment & Plan Note (Signed)
BP: (!) 145/84 today. Poorly controlled. Goal of <130/80. Continue to work on healthy dietary habits and exercise. Follow up in 4 weeks.   Medication regimen: Increase amlodipine to 5 mg daily

## 2022-06-24 ENCOUNTER — Encounter: Payer: Self-pay | Admitting: Student

## 2022-07-04 DIAGNOSIS — Z01419 Encounter for gynecological examination (general) (routine) without abnormal findings: Secondary | ICD-10-CM | POA: Diagnosis not present

## 2022-07-10 ENCOUNTER — Other Ambulatory Visit: Payer: Self-pay | Admitting: Student

## 2022-07-10 DIAGNOSIS — K219 Gastro-esophageal reflux disease without esophagitis: Secondary | ICD-10-CM

## 2022-07-12 ENCOUNTER — Other Ambulatory Visit: Payer: Self-pay | Admitting: Student

## 2022-07-12 DIAGNOSIS — K219 Gastro-esophageal reflux disease without esophagitis: Secondary | ICD-10-CM

## 2022-07-12 DIAGNOSIS — J019 Acute sinusitis, unspecified: Secondary | ICD-10-CM | POA: Diagnosis not present

## 2022-07-16 ENCOUNTER — Other Ambulatory Visit: Payer: Self-pay | Admitting: Student

## 2022-07-16 MED ORDER — NIRMATRELVIR/RITONAVIR (PAXLOVID) TABLET (RENAL DOSING): 2.0000 | ORAL_TABLET | Freq: Two times a day (BID) | ORAL | 0 refills | Status: AC

## 2022-07-16 MED ORDER — NIRMATRELVIR/RITONAVIR (PAXLOVID) TABLET (RENAL DOSING): 2.0000 | ORAL_TABLET | Freq: Two times a day (BID) | ORAL | 0 refills | Status: DC

## 2022-07-16 NOTE — Telephone Encounter (Signed)
Patient calls nurse line regarding issues with Paxlovid prescription.   Prescription was sent to "print."   I have resent prescription electronically.   Talbot Grumbling, RN

## 2022-07-19 ENCOUNTER — Ambulatory Visit: Payer: BC Managed Care – PPO | Admitting: Student

## 2022-07-25 ENCOUNTER — Ambulatory Visit (INDEPENDENT_AMBULATORY_CARE_PROVIDER_SITE_OTHER): Payer: BC Managed Care – PPO | Admitting: Student

## 2022-07-25 ENCOUNTER — Encounter: Payer: Self-pay | Admitting: Student

## 2022-07-25 VITALS — BP 132/80 | HR 83 | Ht 68.0 in | Wt 179.0 lb

## 2022-07-25 DIAGNOSIS — I10 Essential (primary) hypertension: Secondary | ICD-10-CM

## 2022-07-25 DIAGNOSIS — K219 Gastro-esophageal reflux disease without esophagitis: Secondary | ICD-10-CM

## 2022-07-25 DIAGNOSIS — Z0000 Encounter for general adult medical examination without abnormal findings: Secondary | ICD-10-CM | POA: Diagnosis not present

## 2022-07-25 NOTE — Assessment & Plan Note (Signed)
BP: 132/80 today. Well controlled. Medication regimen: Amlodipine 5 mg daily

## 2022-07-25 NOTE — Assessment & Plan Note (Signed)
Advised patient to sign records release for [facility] to send colonoscopy results.  Unfortunately was unable to obtain Pap results from her prior PCP.  Recommended 1 more screening versus no more further given her age and normal results in the past (per patient).

## 2022-07-25 NOTE — Assessment & Plan Note (Signed)
Improved with omeprazole 40 mg daily.  Continue for total 67-monthcourse to end in April 2024.  Should her cough return, may need to workup further.

## 2022-07-25 NOTE — Progress Notes (Signed)
  SUBJECTIVE:   CHIEF COMPLAINT / HPI:   Hypertension: BP: 132/80 today. Home medications include: Amlodipine 5 mg daily. She endorses taking these medications as prescribed. Patient has had a BMP in the past 1 year.  GERD: started on Omeprazole 40mg  for subacute cough suspected to be GERD.  Her cough resolved but she got sick recently and so he came back.  She is continue taking this medication.  PERTINENT  PMH / PSH: Allergic rhinitis, HTN, CKD 3  Patient Care Team: Wells Guiles, DO as PCP - General (Family Medicine) OBJECTIVE:  BP 132/80   Pulse 83   Ht 5\' 8"  (1.727 m)   Wt 179 lb (81.2 kg)   SpO2 98%   BMI 27.22 kg/m  General: Well-appearing, NAD CV: RRR, no murmurs auscultated Pulm: CTAB, normal WOB  ASSESSMENT/PLAN:  Primary hypertension Assessment & Plan: BP: 132/80 today. Well controlled. Medication regimen: Amlodipine 5 mg daily   Gastroesophageal reflux disease, unspecified whether esophagitis present Assessment & Plan: Improved with omeprazole 40 mg daily.  Continue for total 78-month course to end in April 2024.  Should her cough return, may need to workup further.   Healthcare maintenance Assessment & Plan: Advised patient to sign records release for [facility] to send colonoscopy results.  Unfortunately was unable to obtain Pap results from her prior PCP.  Recommended 1 more screening versus no more further given her age and normal results in the past (per patient).   Return if symptoms worsen or fail to improve. Wells Guiles, DO 07/25/2022, 4:46 PM PGY-2, [place]

## 2022-08-13 ENCOUNTER — Other Ambulatory Visit: Payer: Self-pay | Admitting: Pulmonary Disease

## 2022-08-13 DIAGNOSIS — J4540 Moderate persistent asthma, uncomplicated: Secondary | ICD-10-CM

## 2022-08-13 DIAGNOSIS — R053 Chronic cough: Secondary | ICD-10-CM

## 2022-10-02 ENCOUNTER — Encounter: Payer: Self-pay | Admitting: Pulmonary Disease

## 2022-10-02 ENCOUNTER — Other Ambulatory Visit: Payer: Self-pay | Admitting: Pulmonary Disease

## 2022-10-04 MED ORDER — TRELEGY ELLIPTA 200-62.5-25 MCG/ACT IN AEPB: 1.0000 | INHALATION_SPRAY | Freq: Every day | RESPIRATORY_TRACT | 0 refills | Status: DC

## 2022-11-07 ENCOUNTER — Ambulatory Visit (INDEPENDENT_AMBULATORY_CARE_PROVIDER_SITE_OTHER): Payer: BC Managed Care – PPO | Admitting: Pulmonary Disease

## 2022-11-07 ENCOUNTER — Encounter: Payer: Self-pay | Admitting: Pulmonary Disease

## 2022-11-07 VITALS — BP 140/82 | HR 74 | Temp 97.9°F | Ht 68.0 in | Wt 179.4 lb

## 2022-11-07 DIAGNOSIS — J301 Allergic rhinitis due to pollen: Secondary | ICD-10-CM

## 2022-11-07 DIAGNOSIS — J4540 Moderate persistent asthma, uncomplicated: Secondary | ICD-10-CM

## 2022-11-07 MED ORDER — PREDNISONE 20 MG PO TABS: ORAL_TABLET | ORAL | 0 refills | Status: AC

## 2022-11-07 MED ORDER — TRELEGY ELLIPTA 200-62.5-25 MCG/ACT IN AEPB: 1.0000 | INHALATION_SPRAY | Freq: Every day | RESPIRATORY_TRACT | 0 refills | Status: DC

## 2022-11-07 MED ORDER — TRELEGY ELLIPTA 200-62.5-25 MCG/ACT IN AEPB: 1.0000 | INHALATION_SPRAY | Freq: Every day | RESPIRATORY_TRACT | 11 refills | Status: DC

## 2022-11-07 NOTE — Patient Instructions (Signed)
Continue Trelegy, I provided 1 year of refills  From the sinus congestion and swelling, take prednisone 40 mg for 5 days then 20 mg for 5 days then stop  Return to clinic in 1 year or sooner as needed with Dr. Judeth Horn

## 2022-11-07 NOTE — Addendum Note (Signed)
Addended byClyda Greener M on: 11/07/2022 03:42 PM   Modules accepted: Orders

## 2022-11-07 NOTE — Progress Notes (Signed)
@Patient  ID: Vanessa Gordon, female    DOB: 1961-04-03, 62 y.o.   MRN: [id number]  Chief Complaint  Patient presents with   Follow-up    Cough improved.  C/o sinus congestion x 3 weeks.  Feels it is due to allergies.    Referring provider: Shelby Mattocks, DO  HPI:   62 y.o. whom we are seeing in follow up for evaluation  cough, asthma.   Doing ok.  Overall breathing doing okay on Trelegy.  Better access to this last several months.  Has had a lot of nasal congestion, some sinus congestion.  Since the middle to early spring.  Associated with pollens etc. usually gets better with as needed nasal sprays.  Persistently worse for the last 3 weeks despite using nasal sprays, Afrin Flonase etc.  HPI at initial visit: At the time, patient complained about 1 week history of shortness of breath, nonproductive cough.  Associate with body aches.  She had a chest x-ray presented by her PCP: Antibiotics.  Did not improve on moxifloxacin.  Recommended come to the ED.  There she denies any fevers.  Was having seasonal allergies.  Endorse some chest discomfort after coughing.  CT scan performed at that time reviewed and interpreted as bilateral upper lobe groundglass but more dense lower lobe infiltrates.  Was placed on antibiotics and sent home.  Gradually symptoms improved.  She has persistent cough and mild dyspnea exertion.  Worse on inclines or stairs.  No time during things are better or worse.  Cough is nonproductive.  She has worsening allergy symptoms, runny nose, watery eyes over the last few weeks.  PMH: Seasonal allergies Surgical history: Cholecystectomy, hysterectomy Family history: Mother CAD, hypertension, asthma Social History: Never smoker, lives in [place] / Pulmonary Flowsheets:   ACT:  Asthma Control Test ACT Total Score  01/02/2022  3:39 PM 22  10/10/2021  4:20 PM 22   MMRC:     No data to display          Epworth:      No data to display           Tests:   FENO:  No results found for: "NITRICOXIDE"  PFT:     No data to display          WALK:      No data to display          Imaging: Personally reviewed and as per EMR discussion this note. No results found.   Lab Results: Personally reviewed and as per EMR, no anemia CBC    Component Value Date/Time   WBC 10.4 08/23/2020 1724   RBC 4.63 08/23/2020 1724   HGB 13.8 08/23/2020 1724   HCT 42.4 08/23/2020 1724   PLT 456 (H) 08/23/2020 1724   MCV 91.6 08/23/2020 1724   MCH 29.8 08/23/2020 1724   MCHC 32.5 08/23/2020 1724   RDW 13.2 08/23/2020 1724   LYMPHSABS 2.5 08/23/2020 1724   MONOABS 0.7 08/23/2020 1724   EOSABS 0.2 08/23/2020 1724   BASOSABS 0.1 08/23/2020 1724    BMET    Component Value Date/Time   NA 145 (H) 06/17/2022 1729   K 5.0 06/17/2022 1729   CL 103 06/17/2022 1729   CO2 25 06/17/2022 1729   GLUCOSE 91 06/17/2022 1729   GLUCOSE 112 (H) 08/23/2020 1724   BUN 12 06/17/2022 1729   CREATININE 1.08 (H) 06/17/2022 1729   CALCIUM 9.5 06/17/2022 1729   GFRNONAA >60 08/23/2020 1724  GFRAA 59 (L) 03/20/2017 1830    BNP No results found for: "BNP"  ProBNP No results found for: "PROBNP"  Specialty Problems       Pulmonary Problems   Allergic rhinitis    Allergies  Allergen Reactions   Honey Bee Venom Protein [Honey Bee Venom]    Penicillins Hives    Has patient had a PCN reaction causing immediate rash, facial/tongue/throat swelling, SOB or lightheadedness with hypotension: Yes  Has patient had a PCN reaction causing severe rash involving mucus membranes or skin necrosis: No  Has patient had a PCN reaction that required hospitalization No  Has patient had a PCN reaction occurring within the last 10 years: No  If all of the above answers are "NO", then may proceed with Cephalosporin use.  Has patient had a PCN reaction causing immediate rash, facial/tongue/throat swelling, SOB or lightheadedness with hypotension: Yes  Has patient had a PCN reaction causing severe rash involving mucus membranes or skin necrosis: No Has patient had a PCN reaction that required hospitalization No Has patient had a PCN reaction occurring within the last 10 years: No If all of the above answers are "NO", then may proceed with Cephalosporin use.    Immunization History  Administered Date(s) Administered   Influenza,inj,Quad PF,6+ Mos 06/17/2022   PFIZER(Purple Top)SARS-COV-2 Vaccination 05/19/2020    Past Medical History:  Diagnosis Date   Acid reflux    Asthma    Atypical hyperplasia of breast    Borderline hypertension    Community acquired pneumonia 09/01/2020   Constipation 06/17/2022   Seasonal allergies     Tobacco History: Social History   Tobacco Use  Smoking Status Never  Smokeless Tobacco Never   Counseling given: Not Answered   Continue to not smoke  Outpatient Encounter Medications as of 11/07/2022  Medication Sig   albuterol (VENTOLIN HFA) 108 (90 Base) MCG/ACT inhaler Inhale 1-2 puffs into the lungs every 4 (four) hours as needed.   amLODipine (NORVASC) 5 MG tablet Take 1 tablet (5 mg total) by mouth at bedtime.   cetirizine (ZYRTEC) 10 MG tablet Take 10 mg by mouth daily.   EPINEPHrine 0.3 mg/0.3 mL IJ SOAJ injection See admin instructions.   Fluticasone-Umeclidin-Vilant (TRELEGY ELLIPTA) 200-62.5-25 MCG/ACT AEPB Inhale 1 puff into the lungs daily.   folic acid (FOLVITE) 1 MG tablet Take 1 mg by mouth daily.    montelukast (SINGULAIR) 10 MG tablet TAKE 1 TABLET(10 MG) BY MOUTH AT BEDTIME   Multiple Vitamin (MULTIVITAMIN) tablet Take 1 tablet by mouth daily.   multivitamin-lutein (OCUVITE-LUTEIN) CAPS capsule Take 1 capsule by mouth daily.   omeprazole (PRILOSEC) 40 MG capsule TAKE 1 CAPSULE(40 MG) BY MOUTH DAILY   predniSONE (DELTASONE) 20 MG tablet Take 2 tablets (40 mg total) by mouth daily with breakfast for 5 days, THEN 1 tablet (20 mg total) daily with breakfast for 5 days.   tamoxifen  (NOLVADEX) 10 MG tablet Take 0.5 tablets (5 mg total) by mouth daily. (Patient not taking: Reported on 11/07/2022)   No facility-administered encounter medications on file as of 11/07/2022.     Review of Systems  Review of Systems  N/a Physical Exam  BP (!) 140/82 (BP Location: Left Arm, Patient Position: Sitting, Cuff Size: Normal)   Pulse 74   Temp 97.9 F (36.6 C) (Oral)   Ht 5\' 8"  (1.727 m)   Wt 179 lb 6.4 oz (81.4 kg)   SpO2 99%   BMI 27.28 kg/m   Wt Readings from Last 5  Encounters:  11/07/22 179 lb 6.4 oz (81.4 kg)  07/25/22 179 lb (81.2 kg)  06/17/22 181 lb 9.6 oz (82.4 kg)  03/15/22 177 lb 4 oz (80.4 kg)  03/12/22 177 lb 3.2 oz (80.4 kg)    BMI Readings from Last 5 Encounters:  11/07/22 27.28 kg/m  07/25/22 27.22 kg/m  06/17/22 27.61 kg/m  03/15/22 26.95 kg/m  03/12/22 26.94 kg/m     Physical Exam General: Well-appearing, no acute distress Eyes: EOMI, no icterus Neck: Supple, no JVP appreciated Cardiovascular: Regular rate and rhythm, no murmurs Pulmonary: Clear to auscultation bilaterally, no wheezing, normal work of breathing Abdomen: Nondistended, bowel sounds present MSK: No synovitis, joint effusion Neuro: Normal gait, no weakness Psych: Normal mood, full affect  Assessment & Plan:   Asthma: Nasal congestion, atopic symptoms.  Breo with some help but still with ongoing symptoms.  Historically triple inhaled therapy helps but access has been difficult.  Currently on Trelegy with good control of symptoms.  To continue, refill today.  Consider stepdown therapies at next visit.  Nasal allergies: Worsening recent nasal congestion.  Despite intranasal therapies.  Short course of prednisone today given what sounds like allergen driven or allergic like rhinosinusitis.  Return in about 1 year (around 11/07/2023).   Karren Burly, MD 11/07/2022

## 2022-11-25 DIAGNOSIS — H2513 Age-related nuclear cataract, bilateral: Secondary | ICD-10-CM | POA: Diagnosis not present

## 2023-01-20 DIAGNOSIS — J019 Acute sinusitis, unspecified: Secondary | ICD-10-CM | POA: Diagnosis not present

## 2023-01-21 DIAGNOSIS — Z1231 Encounter for screening mammogram for malignant neoplasm of breast: Secondary | ICD-10-CM | POA: Diagnosis not present

## 2023-01-21 DIAGNOSIS — M8588 Other specified disorders of bone density and structure, other site: Secondary | ICD-10-CM | POA: Diagnosis not present

## 2023-01-21 DIAGNOSIS — N958 Other specified menopausal and perimenopausal disorders: Secondary | ICD-10-CM | POA: Diagnosis not present

## 2023-01-21 DIAGNOSIS — E349 Endocrine disorder, unspecified: Secondary | ICD-10-CM | POA: Diagnosis not present

## 2023-01-30 ENCOUNTER — Ambulatory Visit (INDEPENDENT_AMBULATORY_CARE_PROVIDER_SITE_OTHER): Payer: BC Managed Care – PPO | Admitting: Pulmonary Disease

## 2023-01-30 ENCOUNTER — Encounter: Payer: Self-pay | Admitting: Pulmonary Disease

## 2023-01-30 VITALS — BP 120/70 | HR 90 | Ht 67.5 in | Wt 180.6 lb

## 2023-01-30 DIAGNOSIS — J4541 Moderate persistent asthma with (acute) exacerbation: Secondary | ICD-10-CM

## 2023-01-30 MED ORDER — CEFDINIR 300 MG PO CAPS: 300.0000 mg | ORAL_CAPSULE | Freq: Two times a day (BID) | ORAL | 0 refills | Status: DC

## 2023-01-30 MED ORDER — PREDNISONE 20 MG PO TABS: ORAL_TABLET | ORAL | 0 refills | Status: AC

## 2023-01-30 NOTE — Progress Notes (Signed)
@Patient  ID: Vanessa Gordon, female    DOB: 03-04-1961, 62 y.o.   MRN: [id number]  Chief Complaint  Patient presents with   Acute Visit    Pt is here for Dry Cough and Drainage for 2 weeks.    Referring provider: Shelby Mattocks, DO  HPI:   62 y.o. whom we are seeing for acute visit with worsening cough.   Doing okay overall.  Fairly well-controlled disease on high-dose Trelegy.  Unfortunately, with pollens over the last couple weeks developed sore throat.  Sinus and nasal congestion.  Worsening cough.  Dyspnea with coughing but dyspnea exertion not any worse, at baseline.  Fairly controlled.  Often times bronchitis with subtle deep in her chest if not treated aggressively.  HPI at initial visit: At the time, patient complained about 1 week history of shortness of breath, nonproductive cough.  Associate with body aches.  She had a chest x-ray presented by her PCP: Antibiotics.  Did not improve on moxifloxacin.  Recommended come to the ED.  There she denies any fevers.  Was having seasonal allergies.  Endorse some chest discomfort after coughing.  CT scan performed at that time reviewed and interpreted as bilateral upper lobe groundglass but more dense lower lobe infiltrates.  Was placed on antibiotics and sent home.  Gradually symptoms improved.  She has persistent cough and mild dyspnea exertion.  Worse on inclines or stairs.  No time during things are better or worse.  Cough is nonproductive.  She has worsening allergy symptoms, runny nose, watery eyes over the last few weeks.  PMH: Seasonal allergies Surgical history: Cholecystectomy, hysterectomy Family history: Mother CAD, hypertension, asthma Social History: Never smoker, lives in [place] / Pulmonary Flowsheets:   ACT:  Asthma Control Test ACT Total Score  01/02/2022  3:39 PM 22  10/10/2021  4:20 PM 22   MMRC:     No data to display          Epworth:      No data to display          Tests:    FENO:  No results found for: "NITRICOXIDE"  PFT:     No data to display          WALK:      No data to display          Imaging: Personally reviewed and as per EMR discussion this note. No results found.   Lab Results: Personally reviewed and as per EMR, no anemia CBC    Component Value Date/Time   WBC 10.4 08/23/2020 1724   RBC 4.63 08/23/2020 1724   HGB 13.8 08/23/2020 1724   HCT 42.4 08/23/2020 1724   PLT 456 (H) 08/23/2020 1724   MCV 91.6 08/23/2020 1724   MCH 29.8 08/23/2020 1724   MCHC 32.5 08/23/2020 1724   RDW 13.2 08/23/2020 1724   LYMPHSABS 2.5 08/23/2020 1724   MONOABS 0.7 08/23/2020 1724   EOSABS 0.2 08/23/2020 1724   BASOSABS 0.1 08/23/2020 1724    BMET    Component Value Date/Time   NA 145 (H) 06/17/2022 1729   K 5.0 06/17/2022 1729   CL 103 06/17/2022 1729   CO2 25 06/17/2022 1729   GLUCOSE 91 06/17/2022 1729   GLUCOSE 112 (H) 08/23/2020 1724   BUN 12 06/17/2022 1729   CREATININE 1.08 (H) 06/17/2022 1729   CALCIUM 9.5 06/17/2022 1729   GFRNONAA >60 08/23/2020 1724   GFRAA 59 (L) 03/20/2017 1830  BNP No results found for: "BNP"  ProBNP No results found for: "PROBNP"  Specialty Problems       Pulmonary Problems   Allergic rhinitis    Allergies  Allergen Reactions   Honey Bee Venom Protein [Honey Bee Venom]    Penicillins Hives    Has patient had a PCN reaction causing immediate rash, facial/tongue/throat swelling, SOB or lightheadedness with hypotension: Yes  Has patient had a PCN reaction causing severe rash involving mucus membranes or skin necrosis: No  Has patient had a PCN reaction that required hospitalization No  Has patient had a PCN reaction occurring within the last 10 years: No  If all of the above answers are "NO", then may proceed with Cephalosporin use.  Has patient had a PCN reaction causing immediate rash, facial/tongue/throat swelling, SOB or lightheadedness with hypotension: Yes Has patient had  a PCN reaction causing severe rash involving mucus membranes or skin necrosis: No Has patient had a PCN reaction that required hospitalization No Has patient had a PCN reaction occurring within the last 10 years: No If all of the above answers are "NO", then may proceed with Cephalosporin use.   Pollen Extract Cough    Also causes drainage    Immunization History  Administered Date(s) Administered   Influenza,inj,Quad PF,6+ Mos 06/17/2022   PFIZER(Purple Top)SARS-COV-2 Vaccination 05/19/2020    Past Medical History:  Diagnosis Date   Acid reflux    Asthma    Atypical hyperplasia of breast    Borderline hypertension    Community acquired pneumonia 09/01/2020   Constipation 06/17/2022   Seasonal allergies     Tobacco History: Social History   Tobacco Use  Smoking Status Never  Smokeless Tobacco Never   Counseling given: Not Answered   Continue to not smoke  Outpatient Encounter Medications as of 01/30/2023  Medication Sig   albuterol (VENTOLIN HFA) 108 (90 Base) MCG/ACT inhaler Inhale 1-2 puffs into the lungs every 4 (four) hours as needed.   amLODipine (NORVASC) 5 MG tablet Take 1 tablet (5 mg total) by mouth at bedtime.   cefdinir (OMNICEF) 300 MG capsule Take 1 capsule (300 mg total) by mouth 2 (two) times daily.   cetirizine (ZYRTEC) 10 MG tablet Take 10 mg by mouth daily.   EPINEPHrine 0.3 mg/0.3 mL IJ SOAJ injection See admin instructions.   Fluticasone-Umeclidin-Vilant (TRELEGY ELLIPTA) 200-62.5-25 MCG/ACT AEPB Inhale 1 puff into the lungs daily.   folic acid (FOLVITE) 1 MG tablet Take 1 mg by mouth daily.    montelukast (SINGULAIR) 10 MG tablet TAKE 1 TABLET(10 MG) BY MOUTH AT BEDTIME   Multiple Vitamin (MULTIVITAMIN) tablet Take 1 tablet by mouth daily.   multivitamin-lutein (OCUVITE-LUTEIN) CAPS capsule Take 1 capsule by mouth daily.   omeprazole (PRILOSEC) 40 MG capsule TAKE 1 CAPSULE(40 MG) BY MOUTH DAILY   predniSONE (DELTASONE) 20 MG tablet Take 2 tablets (40  mg total) by mouth daily with breakfast for 5 days, THEN 1 tablet (20 mg total) daily with breakfast for 5 days.   [DISCONTINUED] Fluticasone-Umeclidin-Vilant (TRELEGY ELLIPTA) 200-62.5-25 MCG/ACT AEPB Inhale 1 puff into the lungs daily.   [DISCONTINUED] tamoxifen (NOLVADEX) 10 MG tablet Take 0.5 tablets (5 mg total) by mouth daily. (Patient not taking: Reported on 11/07/2022)   No facility-administered encounter medications on file as of 01/30/2023.     Review of Systems  Review of Systems  N/a Physical Exam  BP 120/70 (BP Location: Right Arm, Cuff Size: Normal)   Pulse 90   Ht 5' 7.5" (  1.715 m)   Wt 180 lb 9.6 oz (81.9 kg)   SpO2 100%   BMI 27.87 kg/m   Wt Readings from Last 5 Encounters:  01/30/23 180 lb 9.6 oz (81.9 kg)  11/07/22 179 lb 6.4 oz (81.4 kg)  07/25/22 179 lb (81.2 kg)  06/17/22 181 lb 9.6 oz (82.4 kg)  03/15/22 177 lb 4 oz (80.4 kg)    BMI Readings from Last 5 Encounters:  01/30/23 27.87 kg/m  11/07/22 27.28 kg/m  07/25/22 27.22 kg/m  06/17/22 27.61 kg/m  03/15/22 26.95 kg/m     Physical Exam General: Well-appearing, no acute distress Eyes: EOMI, no icterus Neck: Supple, no JVP appreciated Oropharynx: Diffusely erythematous without focal finding, no purulence Cardiovascular: Regular rate and rhythm, no murmurs Pulmonary: Clear to auscultation bilaterally, no wheezing, normal work of breathing Abdomen: Nondistended, bowel sounds present MSK: No synovitis, joint effusion Neuro: Normal gait, no weakness Psych: Normal mood, full affect  Assessment & Plan:   Asthma: Nasal congestion, atopic symptoms.  Breo with some help but still with ongoing symptoms.  Historically improved or well-controlled triple inhaled therapy helps but access has been difficult.  Continue high-dose Trelegy.  Given mild exacerbation, prednisone 40 mg for 5 days and 20 mg for 5 days then stop.  Sinusitis: 2 weeks of symptoms.  Not improved with conservative management.   Cefdinir 1 tab twice daily for 7 days.  Prednisone as above.  Return in about 6 months (around 07/30/2023) for f/u Dr. Judeth Horn.   Karren Burly, MD 01/30/2023

## 2023-01-30 NOTE — Patient Instructions (Addendum)
I am sorry you are not feeling well  Your lungs sound clear which is good  Take prednisone 40 mg for 5 days then 20 mg for 5 days then stop  Take cefdinir 1 tablet twice a day for 7 days then stop  Continue all your other inhalers  You should have enough refills of Trelegy through next June  I did provide 1 sample, will last 14 days.  The cupboard is pretty bare.  Feel free to check in with [place] in the future if you need access to samples.  Return to clinic in 6 months or sooner as needed with Dr. Judeth Horn

## 2023-03-21 ENCOUNTER — Telehealth (INDEPENDENT_AMBULATORY_CARE_PROVIDER_SITE_OTHER): Payer: BC Managed Care – PPO | Admitting: Primary Care

## 2023-03-21 DIAGNOSIS — J4541 Moderate persistent asthma with (acute) exacerbation: Secondary | ICD-10-CM | POA: Diagnosis not present

## 2023-03-21 DIAGNOSIS — R052 Subacute cough: Secondary | ICD-10-CM

## 2023-03-21 DIAGNOSIS — J0101 Acute recurrent maxillary sinusitis: Secondary | ICD-10-CM | POA: Diagnosis not present

## 2023-03-21 MED ORDER — PREDNISONE 10 MG PO TABS: ORAL_TABLET | ORAL | 0 refills | Status: DC

## 2023-03-21 MED ORDER — CEFDINIR 300 MG PO CAPS: 300.0000 mg | ORAL_CAPSULE | Freq: Two times a day (BID) | ORAL | 0 refills | Status: DC

## 2023-03-21 MED ORDER — PROMETHAZINE-DM 6.25-15 MG/5ML PO SYRP: 5.0000 mL | ORAL_SOLUTION | Freq: Four times a day (QID) | ORAL | 0 refills | Status: DC | PRN

## 2023-03-21 MED ORDER — ALBUTEROL SULFATE (2.5 MG/3ML) 0.083% IN NEBU: 2.5000 mg | INHALATION_SOLUTION | Freq: Four times a day (QID) | RESPIRATORY_TRACT | 2 refills | Status: AC | PRN

## 2023-03-21 NOTE — Progress Notes (Signed)
Virtual Visit via Video Note  I connected with Vanessa Gordon on 03/21/23 at  1:30 PM EST by a video enabled telemedicine application and verified that I am speaking with the correct person using two identifiers.  Location: Patient: Home Provider: Office    I discussed the limitations of evaluation and management by telemedicine and the availability of in person appointments. The patient expressed understanding and agreed to proceed.  History of Present Illness: 62 year old female, never smoked. PMH significant for HTN, allergic rhinitis, GERD, CKD.  Patient of Dr. Judeth Horn.  03/21/2023 Discussed the use of AI scribe software for clinical note transcription with the patient, who gave verbal consent to proceed.  History of Present Illness   The patient, with a history of asthma, presents with symptoms suggestive of a respiratory infection that began on Monday. She reports a persistent cough that has led to chest discomfort and the need to use her albuterol inhaler following coughing fits. The cough is triggered by deep breaths and is associated with sinus congestion and facial pain, suggestive of a concurrent sinus infection.  The patient has attempted over-the-counter remedies, including Vicks VapoRub and Nyquil, and even used a small amount of leftover hydrocodone for symptomatic relief. She also tried a lung support supplement, which induced productive coughing of foamy sputum. Despite these efforts, her symptoms have persisted.  In addition to the cough and sinus symptoms, the patient reports nocturnal wheezing. Her asthma has been somewhat disruptive, affecting her productivity at home and work, and causing her to wake up two to three nights a week.  The patient denies fever but notes exposure to sick individuals at her workplace earlier in the week. She has also been using saline nasal rinses for symptomatic relief.      Observations/Objective:  Able to speak in mostly full  sentences with frequent coughing   Assessment and Plan:  1. Moderate persistent asthma with acute exacerbation - Ambulatory Referral for DME  2. Subacute cough  3. Acute recurrent maxillary sinusitis     Asthma exacerbation Increased coughing fits, wheezing at night, and use of albuterol rescue inhaler. ACT score indicates somewhat controlled asthma. -Continue Trelegy daily. -Start Prednisone 40mg  for 5 days, then 20mg  for 5 days. -Use albuterol nebulizer every 6 hours for sob/wheezing -DME order placed for nebulizer machine.  Acute sinusitis Symptoms of facial pain and congestion started on Monday. No fever. Exposure to sick individuals at work. -Start Cefdinir 300mg  twice a day for 10 days. -Continue saline nasal rinses 1-2 times a day as needed.  Cough Persistent despite over-the-counter remedies. Disrupting sleep. -Start Promethazine DM every 6 hours.     Follow Up Instructions:  - Advised patient to notify office if symptoms do not improve in 5-7 days or worsen    I discussed the assessment and treatment plan with the patient. The patient was provided an opportunity to ask questions and all were answered. The patient agreed with the plan and demonstrated an understanding of the instructions.   The patient was advised to call back or seek an in-person evaluation if the symptoms worsen or if the condition fails to improve as anticipated.  I provided 22 minutes of non-face-to-face time during this encounter.   Glenford Bayley, NP

## 2023-03-21 NOTE — Patient Instructions (Signed)
VISIT SUMMARY:  You visited [place] today due to a persistent cough, chest discomfort, and sinus congestion that began earlier this week. These symptoms have been affecting your asthma, causing nocturnal wheezing and increased use of your rescue inhaler. You have also been experiencing facial pain and sinus congestion, likely due to a sinus infection.  YOUR PLAN:  -ASTHMA EXACERBATION: An asthma exacerbation means your asthma symptoms have worsened. We will continue your current medication, Trelegy, and add Prednisone to reduce inflammation. You should take 40mg  of Prednisone for 5 days, followed by 20mg  for another 5 days. Use your albuterol nebulizer every 6 hours, and we will order a nebulizer machine for you.  -ACUTE SINUSITIS: Acute sinusitis is an infection of the sinuses causing facial pain and congestion. We will start you on Cefdinir 300mg  twice a day for 10 days to treat the infection. Continue using saline nasal rinses 1-2 times a day as needed.  -COUGH: Your persistent cough is likely due to both your asthma and sinus infection. To help with the cough, we will start you on Promethazine DM every 6 hours.  INSTRUCTIONS:  Please follow up in 5 days or sooner if your symptoms worsen.

## 2023-03-24 ENCOUNTER — Telehealth: Payer: Self-pay | Admitting: Pulmonary Disease

## 2023-03-24 NOTE — Telephone Encounter (Signed)
Pt didn't rcvd the correct quantity of medication

## 2023-03-25 MED ORDER — PREDNISONE 10 MG PO TABS: ORAL_TABLET | ORAL | 0 refills | Status: DC

## 2023-03-25 NOTE — Telephone Encounter (Signed)
Pt returning missed call. 

## 2023-03-25 NOTE — Telephone Encounter (Signed)
Ill send in another 10 tablets, sorry about that

## 2023-03-25 NOTE — Telephone Encounter (Signed)
Called and spoke with pt she was informed about the prednisone being sent in. Nfn

## 2023-03-25 NOTE — Telephone Encounter (Signed)
See Telephone Encounter from 03/24/23.

## 2023-03-25 NOTE — Telephone Encounter (Signed)
ATC patient x1.  LVM to return call. 

## 2023-03-25 NOTE — Telephone Encounter (Signed)
Per chart: Prednisone Quantity: 20 tablet Refills: 0        Sig: Take 40mg  x 5 days; 20mg  x 5 days    Please advise, thanks!

## 2023-03-31 DIAGNOSIS — J4541 Moderate persistent asthma with (acute) exacerbation: Secondary | ICD-10-CM | POA: Diagnosis not present

## 2023-04-07 ENCOUNTER — Other Ambulatory Visit: Payer: Self-pay | Admitting: Student

## 2023-04-07 ENCOUNTER — Ambulatory Visit (INDEPENDENT_AMBULATORY_CARE_PROVIDER_SITE_OTHER): Payer: BC Managed Care – PPO | Admitting: Student

## 2023-04-07 ENCOUNTER — Ambulatory Visit: Payer: BC Managed Care – PPO

## 2023-04-07 VITALS — BP 131/80 | HR 97 | Ht 68.0 in | Wt 184.4 lb

## 2023-04-07 DIAGNOSIS — K219 Gastro-esophageal reflux disease without esophagitis: Secondary | ICD-10-CM | POA: Diagnosis not present

## 2023-04-07 DIAGNOSIS — R1011 Right upper quadrant pain: Secondary | ICD-10-CM | POA: Diagnosis not present

## 2023-04-07 MED ORDER — OMEPRAZOLE 40 MG PO CPDR: 40.0000 mg | DELAYED_RELEASE_CAPSULE | Freq: Every day | ORAL | 0 refills | Status: DC

## 2023-04-07 NOTE — Assessment & Plan Note (Signed)
States that she has been having more mucus production in her throat.  She wonders this is related to allergies.  She is scheduled to see an allergist soon.  Only taking 20 mg of Prilosec daily. -Increase to 40 mg Prilosec daily -GI referral given worsening reflux symptoms in patient over 50

## 2023-04-07 NOTE — Patient Instructions (Signed)
It was great to see you! Thank you for allowing me to participate in your care!   Our plans for today:  - CMP and Hep C test today - Recommend sending colonoscopy over, could place another GI referral for reflux symptoms - Starting 40 mg protonix  Take care and seek immediate care sooner if you develop any concerns.  Levin Erp, MD

## 2023-04-07 NOTE — Progress Notes (Signed)
    SUBJECTIVE:   CHIEF COMPLAINT / HPI: right upper abdominal pain   Presents with a week of right upper quadrant abdominal pain, similar to their previous gallstone pain (s/p cholecystectomy 35 years ago). The pain was severe on Tuesday, radiating to the back. The pain has improved with a low fat diet and currently no abdominal pain. They deny vomiting, diarrhea, dysuria/frequency, but report increased burping. They have a history of constipation and their last bowel movement was this morning, which was normal. The pain is described as a 'flutter' and is worse when lying on the right side. They have been managing the pain with over-the-counter Motrin.  Denies any fevers.  They also report issues with increased mucus production in throat, which they attribute to allergies. They have an upcoming appointment with an allergist.  PERTINENT  PMH / PSH: CKD3, history of cholecystectomy 35 years ago, reflux  OBJECTIVE:   BP 131/80   Pulse 97   Ht 5\' 8"  (1.727 m)   Wt 184 lb 6.4 oz (83.6 kg)   SpO2 97%   BMI 28.04 kg/m   General: Well appearing, NAD, awake, alert, responsive to questions Head: Normocephalic atraumatic CV: Regular rate and rhythm no murmurs rubs or gallops Respiratory: Clear to ausculation bilaterally, no wheezes rales or crackles, chest rises symmetrically,  no increased work of breathing Abdomen: Soft, non-tender, non-distended, normoactive bowel sounds, negative murphy, no cva tenderness  ASSESSMENT/PLAN:   Assessment & Plan Right upper quadrant abdominal pain Pain is now resolved and abdominal examination is benign.  Patient states she has had a colonoscopy in 2022 which was normal but we do not have records.  Possible that this is related to gas pain given that it resolved pretty quickly within a day. -CMP -Hepatitis C Gastroesophageal reflux disease, unspecified whether esophagitis present States that she has been having more mucus production in her throat.  She  wonders this is related to allergies.  She is scheduled to see an allergist soon.  Only taking 20 mg of Prilosec daily. -Increase to 40 mg Prilosec daily -GI referral given worsening reflux symptoms in patient over 50   Levin Erp, MD [facility]

## 2023-04-08 LAB — COMPREHENSIVE METABOLIC PANEL
ALT: 22 [IU]/L (ref 0–32)
AST: 17 [IU]/L (ref 0–40)
Albumin: 4.2 g/dL (ref 3.9–4.9)
Alkaline Phosphatase: 101 [IU]/L (ref 44–121)
BUN/Creatinine Ratio: 16 (ref 12–28)
BUN: 15 mg/dL (ref 8–27)
Bilirubin Total: 0.2 mg/dL (ref 0.0–1.2)
CO2: 24 mmol/L (ref 20–29)
Calcium: 9.4 mg/dL (ref 8.7–10.3)
Chloride: 104 mmol/L (ref 96–106)
Creatinine, Ser: 0.92 mg/dL (ref 0.57–1.00)
Globulin, Total: 2.3 g/dL (ref 1.5–4.5)
Glucose: 82 mg/dL (ref 70–99)
Potassium: 4.3 mmol/L (ref 3.5–5.2)
Sodium: 142 mmol/L (ref 134–144)
Total Protein: 6.5 g/dL (ref 6.0–8.5)
eGFR: 70 mL/min/{1.73_m2} (ref >59)

## 2023-04-08 LAB — HCV INTERPRETATION

## 2023-04-08 LAB — HCV AB W REFLEX TO QUANT PCR: HCV Ab: NONREACTIVE

## 2023-04-15 NOTE — Progress Notes (Unsigned)
New Patient Note  RE: Vanessa Gordon MRN: [id number] DOB: November 23, 1960 Date of Office Visit: 04/16/2023  Consult requested by: Shelby Mattocks, DO Primary care provider: Shelby Mattocks, DO  Chief Complaint: No chief complaint on file.  History of Present Illness: I had the pleasure of seeing Vanessa Gordon for initial evaluation at the [facility] on 04/15/2023. Vanessa Gordon is a 62 y.o. female, who is referred here by Shelby Mattocks, DO for the evaluation of ***.  Discussed the use of AI scribe software for clinical note transcription with the patient, who gave verbal consent to proceed.  History of Present Illness             ***  Assessment and Plan: Vanessa Gordon is a 62 y.o. female with: ***  Assessment and Plan               No follow-ups on file.  No orders of the defined types were placed in this encounter.  Lab Orders  No laboratory test(s) ordered today    Other allergy screening: Asthma: {Blank single:19197::"yes","no"} Rhino conjunctivitis: {Blank single:19197::"yes","no"} Food allergy: {Blank single:19197::"yes","no"} Medication allergy: {Blank single:19197::"yes","no"} Hymenoptera allergy: {Blank single:19197::"yes","no"} Urticaria: {Blank single:19197::"yes","no"} Eczema:{Blank single:19197::"yes","no"} History of recurrent infections suggestive of immunodeficency: {Blank single:19197::"yes","no"}  Diagnostics: Spirometry:  Tracings reviewed. Her effort: {Blank single:19197::"Good reproducible efforts.","It was hard to get consistent efforts and there is a question as to whether this reflects a maximal maneuver.","Poor effort, data can not be interpreted."} FVC: ***L FEV1: ***L, ***% predicted FEV1/FVC ratio: ***% Interpretation: {Blank single:19197::"Spirometry consistent with mild obstructive disease","Spirometry consistent with moderate obstructive disease","Spirometry consistent with severe obstructive disease","Spirometry  consistent with possible restrictive disease","Spirometry consistent with mixed obstructive and restrictive disease","Spirometry uninterpretable due to technique","Spirometry consistent with normal pattern","No overt abnormalities noted given today's efforts"}.  Please see scanned spirometry results for details.  Skin Testing: {Blank single:19197::"Select foods","Environmental allergy panel","Environmental allergy panel and select foods","Food allergy panel","None","Deferred due to recent antihistamines use"}. *** Results discussed with patient/family.   Past Medical History: Patient Active Problem List   Diagnosis Date Noted  . Healthcare maintenance 06/18/2022  . Stage 3a chronic kidney disease (CKD) (HCC) 06/18/2022  . Esophageal reflux 06/17/2022  . Allergic rhinitis 06/17/2022  . External hemorrhoid 06/17/2022  . Hypertensive disorder 05/10/2022  . Atypical lobular hyperplasia of left breast 02/07/2022  . Family history of early CAD 09/06/2020   Past Medical History:  Diagnosis Date  . Acid reflux   . Asthma   . Atypical hyperplasia of breast   . Borderline hypertension   . Community acquired pneumonia 09/01/2020  . Constipation 06/17/2022  . Seasonal allergies    Past Surgical History: Past Surgical History:  Procedure Laterality Date  . BREAST LUMPECTOMY WITH RADIOACTIVE SEED LOCALIZATION Left 03/15/2022   Procedure: LEFT BREAST LUMPECTOMY WITH RADIOACTIVE SEED LOCALIZATION;  Surgeon: Griselda Miner, MD;  Location: [facility];  Service: General;  Laterality: Left;  . CESAREAN SECTION  05/13/1986  . CHOLECYSTECTOMY  1989   Medication List:  Current Outpatient Medications  Medication Sig Dispense Refill  . albuterol (PROVENTIL) (2.5 MG/3ML) 0.083% nebulizer solution Take 3 mLs (2.5 mg total) by nebulization every 6 (six) hours as needed for wheezing or shortness of breath. 75 mL 2  . albuterol (VENTOLIN HFA) 108 (90 Base) MCG/ACT inhaler Inhale 1-2 puffs  into the lungs every 4 (four) hours as needed. 6.7 g 6  . amLODipine (NORVASC) 5 MG tablet Take 1 tablet (5 mg total) by mouth at bedtime. 30  tablet 0  . cefdinir (OMNICEF) 300 MG capsule Take 1 capsule (300 mg total) by mouth 2 (two) times daily. 20 capsule 0  . cetirizine (ZYRTEC) 10 MG tablet Take 10 mg by mouth daily.    [name] Kitchen EPINEPHrine 0.3 mg/0.3 mL IJ SOAJ injection See admin instructions.    . Fluticasone-Umeclidin-Vilant (TRELEGY ELLIPTA) 200-62.5-25 MCG/ACT AEPB Inhale 1 puff into the lungs daily. 60 each 11  . folic acid (FOLVITE) 1 MG tablet Take 1 mg by mouth daily.   11  . montelukast (SINGULAIR) 10 MG tablet TAKE 1 TABLET(10 MG) BY MOUTH AT BEDTIME 30 tablet 11  . Multiple Vitamin (MULTIVITAMIN) tablet Take 1 tablet by mouth daily.    . multivitamin-lutein (OCUVITE-LUTEIN) CAPS capsule Take 1 capsule by mouth daily.    [name] Kitchen omeprazole (PRILOSEC) 40 MG capsule TAKE 1 CAPSULE(40 MG) BY MOUTH DAILY 90 capsule 0  . predniSONE (DELTASONE) 10 MG tablet 20mg  x 5 days 10 tablet 0  . promethazine-dextromethorphan (PROMETHAZINE-DM) 6.25-15 MG/5ML syrup Take 5 mLs by mouth 4 (four) times daily as needed. 240 mL 0   No current facility-administered medications for this visit.   Allergies: Allergies  Allergen Reactions  . Honey Bee Venom Protein [Honey Bee Venom]   . Penicillins Hives    Has patient had a PCN reaction causing immediate rash, facial/tongue/throat swelling, SOB or lightheadedness with hypotension: Yes  Has patient had a PCN reaction causing severe rash involving mucus membranes or skin necrosis: No  Has patient had a PCN reaction that required hospitalization No  Has patient had a PCN reaction occurring within the last 10 years: No  If all of the above answers are "NO", then may proceed with Cephalosporin use.  Has patient had a PCN reaction causing immediate rash, facial/tongue/throat swelling, SOB or lightheadedness with hypotension: Yes Has patient had a PCN reaction  causing severe rash involving mucus membranes or skin necrosis: No Has patient had a PCN reaction that required hospitalization No Has patient had a PCN reaction occurring within the last 10 years: No If all of the above answers are "NO", then may proceed with Cephalosporin use.  . Pollen Extract Cough    Also causes drainage   Social History: Social History   Socioeconomic History  . Marital status: Single    Spouse name: Not on file  . Number of children: 1  . Years of education: Not on file  . Highest education level: Associate degree: academic program  Occupational History  . Not on file  Tobacco Use  . Smoking status: Never  . Smokeless tobacco: Never  Vaping Use  . Vaping status: Never Used  Substance and Sexual Activity  . Alcohol use: Yes    Alcohol/week: 14.0 standard drinks of alcohol    Types: 14 Glasses of wine per week    Comment: 2 glasses wine/day  . Drug use: No  . Sexual activity: Not on file  Other Topics Concern  . Not on file  Social History Narrative   Lives at home with self, has 1 pet cat.  Vanessa Gordon is able to get to her own appointments as Vanessa Gordon owns her car.  No religious or personal beliefs that affect her health care.  Notes medical decision maker would be her son Maia Petties).  Does not exercise regularly.   Social Determinants of Health   Financial Resource Strain: Low Risk  (04/07/2023)   Overall Financial Resource Strain (CARDIA)   . Difficulty of Paying Living Expenses: Not hard at all  Food Insecurity: No Food Insecurity (04/07/2023)   Hunger Vital Sign   . Worried About Programme researcher, broadcasting/film/video in the Last Year: Never true   . Ran Out of Food in the Last Year: Never true  Transportation Needs: No Transportation Needs (04/07/2023)   PRAPARE - Transportation   . Lack of Transportation (Medical): No   . Lack of Transportation (Non-Medical): No  Physical Activity: Insufficiently Active (04/07/2023)   Exercise Vital Sign   . Days of Exercise per Week:  2 days   . Minutes of Exercise per Session: 20 min  Stress: No Stress Concern Present (04/07/2023)   [employer] of Occupational Health - Occupational Stress Questionnaire   . Feeling of Stress : Not at all  Social Connections: Moderately Integrated (04/07/2023)   Social Connection and Isolation Panel [NHANES]   . Frequency of Communication with Friends and Family: Twice a week   . Frequency of Social Gatherings with Friends and Family: Twice a week   . Attends Religious Services: 1 to 4 times per year   . Active Member of Clubs or Organizations: No   . Attends Banker Meetings: Not on file   . Marital Status: Living with partner   Lives in a ***. Smoking: *** Occupation: ***  Environmental HistorySurveyor, minerals in the house: Copywriter, advertising in the family room: {Blank single:19197::"yes","no"} Carpet in the bedroom: {Blank single:19197::"yes","no"} Heating: {Blank single:19197::"electric","gas","heat pump"} Cooling: {Blank single:19197::"central","window","heat pump"} Pet: {Blank single:19197::"yes ***","no"}  Family History: Family History  Problem Relation Age of Onset  . Heart disease Mother   . Hypertension Mother   . Asthma Mother   . Heart attack Father   . Hypertension Brother   . Renal cancer Brother    Problem                               Relation Asthma                                   *** Eczema                                *** Food allergy                          *** Allergic rhino conjunctivitis     ***  Review of Systems  Constitutional:  Negative for appetite change, chills, fever and unexpected weight change.  HENT:  Negative for congestion and rhinorrhea.   Eyes:  Negative for itching.  Respiratory:  Negative for cough, chest tightness, shortness of breath and wheezing.   Cardiovascular:  Negative for chest pain.  Gastrointestinal:  Negative for abdominal pain.  Genitourinary:  Negative for  difficulty urinating.  Skin:  Negative for rash.  Neurological:  Negative for headaches.   Objective: There were no vitals taken for this visit. There is no height or weight on file to calculate BMI. Physical Exam Vitals and nursing note reviewed.  Constitutional:      Appearance: Normal appearance. Vanessa Gordon is well-developed.  HENT:     Head: Normocephalic and atraumatic.     Right Ear: Tympanic membrane and external ear normal.     Left Ear: Tympanic membrane and external ear normal.     Nose: Nose normal.  Mouth/Throat:     Mouth: Mucous membranes are moist.     Pharynx: Oropharynx is clear.  Eyes:     Conjunctiva/sclera: Conjunctivae normal.  Cardiovascular:     Rate and Rhythm: Normal rate and regular rhythm.     Heart sounds: Normal heart sounds. No murmur heard.    No friction rub. No gallop.  Pulmonary:     Effort: Pulmonary effort is normal.     Breath sounds: Normal breath sounds. No wheezing, rhonchi or rales.  Musculoskeletal:     Cervical back: Neck supple.  Skin:    General: Skin is warm.     Findings: No rash.  Neurological:     Mental Status: Vanessa Gordon is alert and oriented to person, place, and time.  Psychiatric:        Behavior: Behavior normal.  The plan was reviewed with the patient/family, and all questions/concerned were addressed.  It was my pleasure to see Vanessa Gordon today and participate in her care. Please feel free to contact me with any questions or concerns.  Sincerely,  Wyline Mood, DO Allergy & Immunology  [facility] office: [phone] [facility] office: [phone]

## 2023-04-16 ENCOUNTER — Other Ambulatory Visit: Payer: Self-pay

## 2023-04-16 ENCOUNTER — Ambulatory Visit (INDEPENDENT_AMBULATORY_CARE_PROVIDER_SITE_OTHER): Payer: BC Managed Care – PPO | Admitting: Allergy

## 2023-04-16 ENCOUNTER — Encounter: Payer: Self-pay | Admitting: Allergy

## 2023-04-16 VITALS — BP 130/84 | HR 94 | Temp 98.6°F | Resp 16 | Ht 67.32 in | Wt 183.5 lb

## 2023-04-16 DIAGNOSIS — J329 Chronic sinusitis, unspecified: Secondary | ICD-10-CM | POA: Diagnosis not present

## 2023-04-16 DIAGNOSIS — J4540 Moderate persistent asthma, uncomplicated: Secondary | ICD-10-CM | POA: Diagnosis not present

## 2023-04-16 DIAGNOSIS — Z880 Allergy status to penicillin: Secondary | ICD-10-CM | POA: Diagnosis not present

## 2023-04-16 DIAGNOSIS — K219 Gastro-esophageal reflux disease without esophagitis: Secondary | ICD-10-CM

## 2023-04-16 DIAGNOSIS — H1013 Acute atopic conjunctivitis, bilateral: Secondary | ICD-10-CM

## 2023-04-16 DIAGNOSIS — J3089 Other allergic rhinitis: Secondary | ICD-10-CM | POA: Diagnosis not present

## 2023-04-16 DIAGNOSIS — B999 Unspecified infectious disease: Secondary | ICD-10-CM

## 2023-04-16 DIAGNOSIS — Z91038 Other insect allergy status: Secondary | ICD-10-CM

## 2023-04-16 NOTE — Patient Instructions (Addendum)
Environmental allergies Get bloodwork  If significant positives will recommend allergy injections. If negative then will need to do skin testing. Start environmental control measures as below - pollen and possibly pets? Use over the counter antihistamines such as Zyrtec (cetirizine), Claritin (loratadine), Allegra (fexofenadine), or Xyzal (levocetirizine) daily as needed. May take twice a day during allergy flares. May switch antihistamines every few months. Use Nasonex (mometasone) nasal spray 2 sprays per nostril once a day as needed for nasal congestion.  Use azelastine nasal spray 1-2 sprays per nostril twice a day as needed for runny nose/drainage. Nasal saline spray (i.e., Simply Saline) or nasal saline lavage (i.e., NeilMed) is recommended as needed and prior to medicated nasal sprays. Continue Singulair (montelukast) 10mg  daily at night. Consider allergy injections for long term control if above medications do not help the symptoms - handout given.   Infections Keep track of infections and antibiotics use. Get bloodwork to look at immune system.   Bee stings Continue to avoid. Get bloodwork. For mild symptoms you can take over the counter antihistamines such as Benadryl 1-2 tablets = 25-50mg  and monitor symptoms closely. If symptoms worsen or if you have severe symptoms including breathing issues, throat closure, significant swelling, whole body hives, severe diarrhea and vomiting, lightheadedness then inject epinephrine and seek immediate medical care afterwards. Emergency action plan given.  Asthma Keep follow up with pulmonology. Today's breathing test showed some mild obstruction. Daily controller medication(s): continue Trelegy 1 puff once a day and rinse mouth after each use.  May use albuterol rescue inhaler 2 puffs or nebulizer every 4 to 6 hours as needed for shortness of breath, chest tightness, coughing, and wheezing. May use albuterol rescue inhaler 2 puffs 5 to  15 minutes prior to strenuous physical activities. Breathing control goals:  Full participation in all desired activities (may need albuterol before activity) Albuterol use two times or less a week on average (not counting use with activity) Cough interfering with sleep two times or less a month Oral steroids no more than once a year No hospitalizations   Penicillin allergy: Consider penicillin allergy skin testing and in office drug challenge in the future.  Over 90% of people with history of penicillin allergy which occurred over 10 years ago are found to be non-allergic.  You must be off antihistamines for 3-5 days before. Must be in good health and not ill. No vaccines/injections/antibiotics within the past 7 days. Plan on being in the office for 2-3 hours and must bring in the drug you want to do the oral challenge for - will send in prescription to pick up a few days before. You must call to schedule an appointment and specify it's for a drug challenge.   Reflux Continue omeprazole 40mg  once day - nothing to eat or drink for 20-30 minutes afterwards.   Get bloodwork We are ordering labs, so please allow 1-2 weeks for the results to come back. With the newly implemented Cures Act, the labs might be visible to you at the same time that they become visible to me. However, I will not address the results until all of the results are back, so please be patient.  In the meantime, continue recommendations in your patient instructions, including avoidance measures (if applicable), until you hear from me.  Return in about 4 months (around 08/15/2023). Or sooner if needed.   Reducing Pollen Exposure Pollen seasons: trees (spring), grass (summer) and ragweed/weeds (fall). Keep windows closed in your home and car to lower  pollen exposure.  Install air conditioning in the bedroom and throughout the house if possible.  Avoid going out in dry windy days - especially early morning. Pollen counts are  highest between 5 - 10 AM and on dry, hot and windy days.  Save outside activities for late afternoon or after a heavy rain, when pollen levels are lower.  Avoid mowing of grass if you have grass pollen allergy. Be aware that pollen can also be transported indoors on people and pets.  Dry your clothes in an automatic dryer rather than hanging them outside where they might collect pollen.  Rinse hair and eyes before bedtime.  Pet Allergen Avoidance: Contrary to popular opinion, there are no "hypoallergenic" breeds of dogs or cats. That is because people are not allergic to an animal's hair, but to an allergen found in the animal's saliva, dander (dead skin flakes) or urine. Pet allergy symptoms typically occur within minutes. For some people, symptoms can build up and become most severe 8 to 12 hours after contact with the animal. People with severe allergies can experience reactions in public places if dander has been transported on the pet owners' clothing. Keeping an animal outdoors is only a partial solution, since homes with pets in the yard still have higher concentrations of animal allergens. Before getting a pet, ask your allergist to determine if you are allergic to animals. If your pet is already considered part of your family, try to minimize contact and keep the pet out of the bedroom and other rooms where you spend a great deal of time. As with dust mites, vacuum carpets often or replace carpet with a hardwood floor, tile or linoleum. High-efficiency particulate air (HEPA) cleaners can reduce allergen levels over time. While dander and saliva are the source of cat and dog allergens, urine is the source of allergens from rabbits, hamsters, mice and [place] pigs; so ask a non-allergic family member to clean the animal's cage. If you have a pet allergy, talk to your allergist about the potential for allergy immunotherapy (allergy shots). This strategy can often provide long-term relief.

## 2023-04-30 DIAGNOSIS — J4541 Moderate persistent asthma with (acute) exacerbation: Secondary | ICD-10-CM | POA: Diagnosis not present

## 2023-05-02 ENCOUNTER — Other Ambulatory Visit: Payer: Self-pay | Admitting: Primary Care

## 2023-05-02 DIAGNOSIS — J4541 Moderate persistent asthma with (acute) exacerbation: Secondary | ICD-10-CM

## 2023-05-02 NOTE — Progress Notes (Signed)
Order for replacement nebulizer supplies

## 2023-05-29 DIAGNOSIS — R059 Cough, unspecified: Secondary | ICD-10-CM | POA: Diagnosis not present

## 2023-05-29 DIAGNOSIS — J159 Unspecified bacterial pneumonia: Secondary | ICD-10-CM | POA: Diagnosis not present

## 2023-05-31 DIAGNOSIS — J4541 Moderate persistent asthma with (acute) exacerbation: Secondary | ICD-10-CM | POA: Diagnosis not present

## 2023-06-05 ENCOUNTER — Ambulatory Visit: Payer: BC Managed Care – PPO | Admitting: Nurse Practitioner

## 2023-06-05 ENCOUNTER — Encounter: Payer: Self-pay | Admitting: Nurse Practitioner

## 2023-06-05 VITALS — BP 149/89 | HR 72 | Temp 98.3°F | Ht 68.0 in | Wt 183.6 lb

## 2023-06-05 DIAGNOSIS — J189 Pneumonia, unspecified organism: Secondary | ICD-10-CM

## 2023-06-05 DIAGNOSIS — J4541 Moderate persistent asthma with (acute) exacerbation: Secondary | ICD-10-CM

## 2023-06-05 LAB — POCT EXHALED NITRIC OXIDE: FeNO level (ppb): 6

## 2023-06-05 MED ORDER — PROMETHAZINE-DM 6.25-15 MG/5ML PO SYRP: 5.0000 mL | ORAL_SOLUTION | Freq: Four times a day (QID) | ORAL | 0 refills | Status: AC | PRN

## 2023-06-05 MED ORDER — PREDNISONE 20 MG PO TABS: 20.0000 mg | ORAL_TABLET | Freq: Every day | ORAL | 0 refills | Status: AC

## 2023-06-05 MED ORDER — AZITHROMYCIN 250 MG PO TABS: ORAL_TABLET | ORAL | 0 refills | Status: DC

## 2023-06-05 MED ORDER — BENZONATATE 200 MG PO CAPS: 200.0000 mg | ORAL_CAPSULE | Freq: Three times a day (TID) | ORAL | 1 refills | Status: DC | PRN

## 2023-06-05 NOTE — Patient Instructions (Addendum)
Continue Albuterol inhaler 2 puffs or 3 mL neb every 6 hours as needed for shortness of breath or wheezing. Notify if symptoms persist despite rescue inhaler/neb use. Use nebs 2-3 times a day until symptoms improve Continue Trelegy 1 puff daily. Brush tongue and rinse mouth afterwards Continue singulair 1 tab At bedtime Continue daily allergy pill Continue flonase nasal spray daily  -Guaifenesin 600 mg Twice daily over the counter as needed for congestion -Promethazine DM cough syrup 2.5-5 mL every 6 hours as needed for cough. May cause drowsiness. Do not drive after taking. -Benzonatate 1 capsule Three times a day as needed for cough  -Azithromycin. Take 2 tabs on day one then 1 tab daily for four additional days. Take with food -Complete doxycycline as previously prescribed -Prednisone 20 mg daily for 5 days. Take in AM with food  Follow up in 4-6 weeks with CXR beforehand with Dr. Judeth Horn or Florentina Addison Aniket Paye,NP. If symptoms do not improve or worsen, please contact office for sooner follow up or seek emergency care.

## 2023-06-05 NOTE — Progress Notes (Signed)
@Patient  ID: Vanessa Gordon, female    DOB: 08-Jul-1960, 63 y.o.   MRN: [id number]  Chief Complaint  Patient presents with   Follow-up    Referring provider: Shelby Mattocks, DO  HPI: 63 year old female, never smoker followed for asthma. She is a patient of Dr. Laurena Gordon and last seen 03/21/2023 by Vanessa Ridges NP. Past medical history significant for HTN, allergic rhinitis, GERD, CKD.   TEST/EVENTS:  10/23/2020 CT chest: multifocal areas of consolidation b/l, decreased. Mild btx. Residual pna vs developing scarring.  04/16/2023 spirometry: FVC 158, FEV1 103, ratio 51  03/21/2023: Video visit with Vanessa Ridges NP. Acute symptoms. Began with URI symptoms Monday. Persistent cough with chest discomfort. Cough triggered by deep breaths. Has sinus congestion with facial pain. Using OTC remedies and albuterol without much relief. Treated for asthma exacerbation with prednisone taper and sinusitis with cefdinir.   06/05/2023: Today - acute Discussed the use of AI scribe software for clinical note transcription with the patient, who gave verbal consent to proceed.  History of Present Illness   The patient, a known asthmatic, presents with a month-long history of a dry cough. She reports no associated fevers or chills. Despite a negative COVID and flu test, the patient was diagnosed with pneumonia at an urgent care center, confirmed by a chest x-ray showing involvement of the right lung base last week. She was prescribed a ten-day course of doxycycline, with three days remaining at the time of this consultation. However, the patient reports no significant improvement in symptoms.  She reports increased shortness of breath and chest tightness, with occasional wheezing noted during sleep. She has been using her albuterol rescue inhaler and nebulizer treatments at home, in addition to her regular Trelegy inhaler.  The patient also reports constant sinus drainage, which she believes is contributing to her cough. She  was previously prescribed codeine cough syrup, but discontinued due to dizziness. She denies any hemoptysis, headaches, sore throat or ear pain.  Eating and drinking well.       Allergies  Allergen Reactions   Honey Bee Venom Protein [Honey Bee Venom]    Penicillins Hives    Has patient had a PCN reaction causing immediate rash, facial/tongue/throat swelling, SOB or lightheadedness with hypotension: Yes  Has patient had a PCN reaction causing severe rash involving mucus membranes or skin necrosis: No  Has patient had a PCN reaction that required hospitalization No  Has patient had a PCN reaction occurring within the last 10 years: No  If all of the above answers are "NO", then may proceed with Cephalosporin use.  Has patient had a PCN reaction causing immediate rash, facial/tongue/throat swelling, SOB or lightheadedness with hypotension: Yes Has patient had a PCN reaction causing severe rash involving mucus membranes or skin necrosis: No Has patient had a PCN reaction that required hospitalization No Has patient had a PCN reaction occurring within the last 10 years: No If all of the above answers are "NO", then may proceed with Cephalosporin use.   Pollen Extract Cough    Also causes drainage    Immunization History  Administered Date(s) Administered   Influenza,inj,Quad PF,6+ Mos 06/17/2022   Influenza-Unspecified 08/12/2022   PFIZER(Purple Top)SARS-COV-2 Vaccination 05/19/2020    Past Medical History:  Diagnosis Date   Acid reflux    Asthma    Atypical hyperplasia of breast    Borderline hypertension    Community acquired pneumonia 09/01/2020   Constipation 06/17/2022   Recurrent upper respiratory infection (URI)    Seasonal  allergies     Tobacco History: Social History   Tobacco Use  Smoking Status Never   Passive exposure: Past  Smokeless Tobacco Never   Counseling given: Not Answered   Outpatient Medications Prior to Visit  Medication Sig Dispense Refill    albuterol (PROVENTIL) (2.5 MG/3ML) 0.083% nebulizer solution Take 3 mLs (2.5 mg total) by nebulization every 6 (six) hours as needed for wheezing or shortness of breath. 75 mL 2   albuterol (VENTOLIN HFA) 108 (90 Base) MCG/ACT inhaler Inhale 1-2 puffs into the lungs every 4 (four) hours as needed. 6.7 g 6   amLODipine (NORVASC) 5 MG tablet Take 1 tablet (5 mg total) by mouth at bedtime. 30 tablet 0   azelastine (ASTELIN) 0.1 % nasal spray Place 2 sprays into both nostrils 2 (two) times daily.     cetirizine (ZYRTEC) 10 MG tablet Take 10 mg by mouth daily.     EPINEPHrine 0.3 mg/0.3 mL IJ SOAJ injection See admin instructions.     fluticasone (FLONASE) 50 MCG/ACT nasal spray Place 2 sprays into both nostrils daily.     Fluticasone-Umeclidin-Vilant (TRELEGY ELLIPTA) 200-62.5-25 MCG/ACT AEPB Inhale 1 puff into the lungs daily. 60 each 11   folic acid (FOLVITE) 1 MG tablet Take 1 mg by mouth daily.   11   montelukast (SINGULAIR) 10 MG tablet TAKE 1 TABLET(10 MG) BY MOUTH AT BEDTIME 30 tablet 11   Multiple Vitamin (MULTIVITAMIN) tablet Take 1 tablet by mouth daily.     multivitamin-lutein (OCUVITE-LUTEIN) CAPS capsule Take 1 capsule by mouth daily.     omeprazole (PRILOSEC) 40 MG capsule TAKE 1 CAPSULE(40 MG) BY MOUTH DAILY 90 capsule 0   promethazine-dextromethorphan (PROMETHAZINE-DM) 6.25-15 MG/5ML syrup Take 5 mLs by mouth 4 (four) times daily as needed. 240 mL 0   No facility-administered medications prior to visit.     Review of Systems:   Constitutional: No weight loss or gain, night sweats, fevers, chills +fatigue, lassitude. HEENT: No headaches, difficulty swallowing, tooth/dental problems, or sore throat. No sneezing, itching, ear ache +nasal congestion, post nasal drip, sinus pressure  CV:  No chest pain, orthopnea, PND, swelling in lower extremities, anasarca, dizziness, palpitations, syncope Resp: +shortness of breath with exertion; dry cough; wheezing. No excess mucus or change in  color of mucus. No hemoptysis. No chest wall deformity GI:  No heartburn, indigestion, abdominal pain, nausea, vomiting, diarrhea, change in bowel habits, loss of appetite, bloody stools.  GU: No dysuria, change in color of urine, urgency or frequency.  No flank pain, no hematuria  Skin: No rash, lesions, ulcerations MSK:  No joint pain or swelling.   Neuro: No dizziness or lightheadedness.  Psych: No depression or anxiety. Mood stable.     Physical Exam:  BP (!) 149/89 (BP Location: Left Arm, Patient Position: Sitting, Cuff Size: Normal)   Pulse 72   Temp 98.3 F (36.8 C) (Oral)   Ht 5\' 8"  (1.727 m)   Wt 183 lb 9.6 oz (83.3 kg)   SpO2 (!) 86%   BMI 27.92 kg/m   GEN: Pleasant, interactive, well-kempt; in no acute distress HEENT:  Normocephalic and atraumatic. EACs patent bilaterally. TM pearly gray with dull light reflex bilaterally; no erythema or edema. PERRLA. Sclera white. Nasal turbinates erythematous, moist and patent bilaterally. Clear rhinorrhea present. Oropharynx pink and moist, without exudate or edema. No lesions, ulcerations NECK:  Supple w/ fair ROM. No JVD present. Normal carotid impulses w/o bruits. Thyroid symmetrical with no goiter or nodules palpated. No lymphadenopathy.  CV: RRR, no m/r/g, no peripheral edema. Pulses intact, +2 bilaterally. No cyanosis, pallor or clubbing. PULMONARY:  Unlabored, regular breathing. End expiratory wheeze bilaterally A&P. Bronchitic cough. No accessory muscle use.  GI: BS present and normoactive. Soft, non-tender to palpation. No organomegaly or masses detected.  MSK: No erythema, warmth or tenderness. Cap refil <2 sec all extrem. No deformities or joint swelling noted.  Neuro: A/Ox3. No focal deficits noted.   Skin: Warm, no lesions or rashe Psych: Normal affect and behavior. Judgement and thought content appropriate.     Lab Results:  CBC    Component Value Date/Time   WBC 10.4 08/23/2020 1724   RBC 4.63 08/23/2020 1724    HGB 13.8 08/23/2020 1724   HCT 42.4 08/23/2020 1724   PLT 456 (H) 08/23/2020 1724   MCV 91.6 08/23/2020 1724   MCH 29.8 08/23/2020 1724   MCHC 32.5 08/23/2020 1724   RDW 13.2 08/23/2020 1724   LYMPHSABS 2.5 08/23/2020 1724   MONOABS 0.7 08/23/2020 1724   EOSABS 0.2 08/23/2020 1724   BASOSABS 0.1 08/23/2020 1724    BMET    Component Value Date/Time   NA 142 04/07/2023 1607   K 4.3 04/07/2023 1607   CL 104 04/07/2023 1607   CO2 24 04/07/2023 1607   GLUCOSE 82 04/07/2023 1607   GLUCOSE 112 (H) 08/23/2020 1724   BUN 15 04/07/2023 1607   CREATININE 0.92 04/07/2023 1607   CALCIUM 9.4 04/07/2023 1607   GFRNONAA >60 08/23/2020 1724   GFRAA 59 (L) 03/20/2017 1830    BNP No results found for: "BNP"   Imaging:  No results found.  Administration History     None           No data to display          No results found for: "NITRICOXIDE"      Assessment & Plan:     Pneumonia Persistent dry cough for one month. Initial chest x-ray indicated right lung base pneumonia per her report. Slow to improve. VS stable. Known allergy to PCN. Add on empiric azithromycin for broader atypical coverage. Repeat CXR in 4-6 weeks to ensure resolution, or sooner, if no improvement.  - Add azithromycin to doxycycline regimen - Complete remaining 3 days of doxycycline - Mucociliary clearance therapies   Asthma exacerbation Asthma exacerbation/bronchitis. Walk test without desaturations on room air. Currently using albuterol, Trelegy, Singulair, and Zyrtec. Add on prednisone burst. Cough control measures. Side effect profile reviewed. Optimize bronchodilator regimen with neb treatments. Action plan in place.  - Prednisone 20 mg daily for 5 days - Utilize promethazine DM and/or benzonatate as needed for cough - Cautioned promethazine DM may cause drowsiness and to not drive after taking  - Continue Trelegy  - Utilize albuterol nebs 2-3 times a day until symptoms improve   Acute  sinusitis Likely contributing to cough due to upper airway irritation. Continue nasal spray regimen. Add on mucolytic therapy. Could consider short course of Afrin nasal spray OTC.  - Continue flonase nasal spray  - Start mucinex OTC  - Consider Afrin nasal spray if symptoms persist; do not use longer than 3-5 days  Follow-up - Follow up 4-6 weeks with repeat CXR        Advised if symptoms do not improve or worsen, to please contact office for sooner follow up or seek emergency care.   I spent 45 minutes of dedicated to the care of this patient on the date of this encounter to include pre-visit  review of records, face-to-face time with the patient discussing conditions above, post visit ordering of testing, clinical documentation with the electronic health record, making appropriate referrals as documented, and communicating necessary findings to members of the patients care team.  Noemi Chapel, NP 06/05/2023  Pt aware and understands NP's role.

## 2023-06-09 ENCOUNTER — Encounter: Payer: Self-pay | Admitting: Nurse Practitioner

## 2023-06-23 ENCOUNTER — Other Ambulatory Visit: Payer: Self-pay | Admitting: Student

## 2023-06-23 DIAGNOSIS — I10 Essential (primary) hypertension: Secondary | ICD-10-CM

## 2023-06-27 ENCOUNTER — Other Ambulatory Visit: Payer: Self-pay | Admitting: Student

## 2023-06-27 DIAGNOSIS — K219 Gastro-esophageal reflux disease without esophagitis: Secondary | ICD-10-CM

## 2023-07-01 DIAGNOSIS — J4541 Moderate persistent asthma with (acute) exacerbation: Secondary | ICD-10-CM | POA: Diagnosis not present

## 2023-07-17 ENCOUNTER — Encounter: Payer: Self-pay | Admitting: Nurse Practitioner

## 2023-07-17 ENCOUNTER — Ambulatory Visit

## 2023-07-17 ENCOUNTER — Ambulatory Visit: Payer: BC Managed Care – PPO | Admitting: Nurse Practitioner

## 2023-07-17 VITALS — BP 136/85 | HR 81 | Ht 68.0 in | Wt 184.4 lb

## 2023-07-17 DIAGNOSIS — J4550 Severe persistent asthma, uncomplicated: Secondary | ICD-10-CM | POA: Diagnosis not present

## 2023-07-17 DIAGNOSIS — J309 Allergic rhinitis, unspecified: Secondary | ICD-10-CM

## 2023-07-17 DIAGNOSIS — R918 Other nonspecific abnormal finding of lung field: Secondary | ICD-10-CM | POA: Diagnosis not present

## 2023-07-17 DIAGNOSIS — J189 Pneumonia, unspecified organism: Secondary | ICD-10-CM | POA: Diagnosis not present

## 2023-07-17 DIAGNOSIS — Z01419 Encounter for gynecological examination (general) (routine) without abnormal findings: Secondary | ICD-10-CM | POA: Diagnosis not present

## 2023-07-17 MED ORDER — AZELASTINE-FLUTICASONE 137-50 MCG/ACT NA SUSP
1.0000 | Freq: Two times a day (BID) | NASAL | 5 refills | Status: AC
Start: 2023-07-17 — End: ?

## 2023-07-17 NOTE — Patient Instructions (Addendum)
 Continue Albuterol inhaler 2 puffs or 3 mL neb every 6 hours as needed for shortness of breath or wheezing. Notify if symptoms persist despite rescue inhaler/neb use. Use nebs 2-3 times a day until symptoms improve Continue Trelegy 1 puff daily. Brush tongue and rinse mouth afterwards Continue singulair 1 tab At bedtime Continue daily allergy pill   -Guaifenesin 600 mg Twice daily over the counter as needed for congestion -Afrin nasal spray over the counter as needed for nasal congestion/allergies. Do not use longer than 3-5 days -Do saline lavage/rinse 1-2 times a day, use bottled distilled water. Then follow with your nasal sprays 20-30 minutes later   -If covered, you can switch the flonase and azelastine to fluticasone-azelastine combo spray (Dymista) 1 spray Twice daily. If not covered, continue to use flonase 2 sprays each nostril daily and azelastine 2 sprays each nostril Twice daily   Have the labs completed by your allergist May consider an injectable asthma medication depending on these  Repeat chest x ray today    Follow up in 6 weeks with Dr. Judeth Horn or Katie Antonisha Waskey,NP. If symptoms do not improve or worsen, please contact office for sooner follow up or seek emergency care.

## 2023-07-17 NOTE — Progress Notes (Signed)
 @Patient  ID: Vanessa Gordon, female    DOB: 04-Sep-1960, 63 y.o.   MRN: [id number]  Chief Complaint  Patient presents with   Follow-up    Referring provider: Shelby Mattocks, Vanessa Gordon  HPI: 63 year old female, never smoker followed for asthma. She is a patient of Vanessa Gordon and last seen 06/05/2023 by Vanessa Quarry NP. Past medical history significant for HTN, allergic rhinitis, GERD, CKD.   TEST/EVENTS:  10/23/2020 CT chest: multifocal areas of consolidation b/l, decreased. Mild btx. Residual pna vs developing scarring.  04/16/2023 spirometry: FVC 158, FEV1 103, ratio 51  03/21/2023: Video visit with Vanessa Ridges NP. Acute symptoms. Began with URI symptoms Monday. Persistent cough with chest discomfort. Cough triggered by deep breaths. Has sinus congestion with facial pain. Using OTC remedies and albuterol without much relief. Treated for asthma exacerbation with prednisone taper and sinusitis with cefdinir.   06/05/2023: OV with Vanessa Nagorski NP. Discussed the use of AI scribe software for clinical note transcription with the patient, who gave verbal consent to proceed. History of Present Illness   The patient, a known asthmatic, presents with a month-long history of a dry cough. She reports no associated fevers or chills. Despite a negative COVID and flu test, the patient was diagnosed with pneumonia at an urgent care center, confirmed by a chest x-ray showing involvement of the right lung base last week. She was prescribed a ten-day course of doxycycline, with three days remaining at the time of this consultation. However, the patient reports no significant improvement in symptoms. She reports increased shortness of breath and chest tightness, with occasional wheezing noted during sleep. She has been using her albuterol rescue inhaler and nebulizer treatments at home, in addition to her regular Trelegy inhaler. The patient also reports constant sinus drainage, which she believes is contributing to her cough. She was  previously prescribed codeine cough syrup, but discontinued due to dizziness. She denies any hemoptysis, headaches, sore throat or ear pain.  Eating and drinking well.  07/17/2023: Today - follow up Patient presents today for follow up. She's been doing better since her last visit. Cough finally resolved for the most part. She is having some increased nasal symptoms with recent weather change. Breathing feels better. Not having much chest tightness or wheezing. Denies any fevers, chills, hemoptysis. Not having to use her albuterol as often. She's on Trelegy. Takes singulair and daily allergy pill for trigger prevention. She was supposed to have allergy/immune testing with allergist but hasn't had this drawn yet.   Allergies  Allergen Reactions   Honey Bee Venom Protein [Honey Bee Venom]    Penicillins Hives    Has patient had a PCN reaction causing immediate rash, facial/tongue/throat swelling, SOB or lightheadedness with hypotension: Yes  Has patient had a PCN reaction causing severe rash involving mucus membranes or skin necrosis: No  Has patient had a PCN reaction that required hospitalization No  Has patient had a PCN reaction occurring within the last 10 years: No  If all of the above answers are "NO", then may proceed with Cephalosporin use.  Has patient had a PCN reaction causing immediate rash, facial/tongue/throat swelling, SOB or lightheadedness with hypotension: Yes Has patient had a PCN reaction causing severe rash involving mucus membranes or skin necrosis: No Has patient had a PCN reaction that required hospitalization No Has patient had a PCN reaction occurring within the last 10 years: No If all of the above answers are "NO", then may proceed with Cephalosporin use.   Pollen Extract Cough  Also causes drainage    Immunization History  Administered Date(s) Administered   Influenza,inj,Quad PF,6+ Mos 06/17/2022   Influenza-Unspecified 08/12/2022   PFIZER(Purple  Top)SARS-COV-2 Vaccination 05/19/2020    Past Medical History:  Diagnosis Date   Acid reflux    Asthma    Atypical hyperplasia of breast    Borderline hypertension    Community acquired pneumonia 09/01/2020   Constipation 06/17/2022   Recurrent upper respiratory infection (URI)    Seasonal allergies     Tobacco History: Social History   Tobacco Use  Smoking Status Never   Passive exposure: Past  Smokeless Tobacco Never   Counseling given: Not Answered   Outpatient Medications Prior to Visit  Medication Sig Dispense Refill   albuterol (PROVENTIL) (2.5 MG/3ML) 0.083% nebulizer solution Take 3 mLs (2.5 mg total) by nebulization every 6 (six) hours as needed for wheezing or shortness of breath. 75 mL 2   albuterol (VENTOLIN HFA) 108 (90 Base) MCG/ACT inhaler Inhale 1-2 puffs into the lungs every 4 (four) hours as needed. 6.7 g 6   amLODipine (NORVASC) 5 MG tablet TAKE 1 TABLET(5 MG) BY MOUTH AT BEDTIME 90 tablet 3   azelastine (ASTELIN) 0.1 % nasal spray Place 2 sprays into both nostrils 2 (two) times daily.     benzonatate (TESSALON) 200 MG capsule Take 1 capsule (200 mg total) by mouth 3 (three) times daily as needed for cough. 30 capsule 1   cetirizine (ZYRTEC) 10 MG tablet Take 10 mg by mouth daily.     EPINEPHrine 0.3 mg/0.3 mL IJ SOAJ injection See admin instructions.     fluticasone (FLONASE) 50 MCG/ACT nasal spray Place 2 sprays into both nostrils daily.     Fluticasone-Umeclidin-Vilant (TRELEGY ELLIPTA) 200-62.5-25 MCG/ACT AEPB Inhale 1 puff into the lungs daily. 60 each 11   folic acid (FOLVITE) 1 MG tablet Take 1 mg by mouth daily.   11   HYDROMET 5-1.5 MG/5ML syrup Take 5 mLs by mouth every 4 (four) hours as needed for cough.     montelukast (SINGULAIR) 10 MG tablet TAKE 1 TABLET(10 MG) BY MOUTH AT BEDTIME 30 tablet 11   Multiple Vitamin (MULTIVITAMIN) tablet Take 1 tablet by mouth daily.     multivitamin-lutein (OCUVITE-LUTEIN) CAPS capsule Take 1 capsule by mouth  daily.     omeprazole (PRILOSEC) 40 MG capsule TAKE 1 CAPSULE(40 MG) BY MOUTH DAILY 90 capsule 0   promethazine-dextromethorphan (PROMETHAZINE-DM) 6.25-15 MG/5ML syrup Take 5 mLs by mouth 4 (four) times daily as needed for cough. 180 mL 0   azithromycin (ZITHROMAX) 250 MG tablet Take 2 tabs on day one then 1 tab daily for four additional days 6 tablet 0   No facility-administered medications prior to visit.     Review of Systems:   Constitutional: No weight loss or gain, night sweats, fevers, chills, fatigue, lassitude. HEENT: No headaches, difficulty swallowing, tooth/dental problems, or sore throat. No sneezing, itching, ear ache +nasal congestion, post nasal drip CV:  No chest pain, orthopnea, PND, swelling in lower extremities, anasarca, dizziness, palpitations, syncope Resp: +shortness of breath with exertion (baseline; improved). No cough. No excess mucus or change in color of mucus. No wheeze. No hemoptysis. No chest wall deformity GI:  No heartburn, indigestion GU: No dysuria, change in color of urine, urgency or frequency.  Skin: No rash, lesions, ulcerations MSK:  No joint pain or swelling.   Neuro: No dizziness or lightheadedness.  Psych: No depression or anxiety. Mood stable.     Physical Exam:  BP 136/85 (BP Location: Left Arm, Patient Position: Sitting, Cuff Size: Large)   Pulse 81   Ht 5\' 8"  (1.727 m)   Wt 184 lb 6.4 oz (83.6 kg)   SpO2 97%   BMI 28.04 kg/m   GEN: Pleasant, interactive, well-kempt; in no acute distress HEENT:  Normocephalic and atraumatic. EACs patent bilaterally. TM pearly gray with present light reflex bilaterally. PERRLA. Sclera white. Nasal turbinates erythematous, moist and patent bilaterally. Clear rhinorrhea present. Oropharynx pink and moist, without exudate or edema. No lesions, ulcerations NECK:  Supple w/ fair ROM. No JVD present. Normal carotid impulses w/o bruits. Thyroid symmetrical with no goiter or nodules palpated. No  lymphadenopathy.   CV: RRR, no m/r/g, no peripheral edema. Pulses intact, +2 bilaterally. No cyanosis, pallor or clubbing. PULMONARY:  Unlabored, regular breathing. Clear bilaterally A&P w/o wheezes/rales/rhonchi. No accessory muscle use.  GI: BS present and normoactive. Soft, non-tender to palpation. No organomegaly or masses detected.  MSK: No erythema, warmth or tenderness. Cap refil <2 sec all extrem. No deformities or joint swelling noted.  Neuro: A/Ox3. No focal deficits noted.   Skin: Warm, no lesions or rashe Psych: Normal affect and behavior. Judgement and thought content appropriate.     Lab Results:  CBC    Component Value Date/Time   WBC 10.4 08/23/2020 1724   RBC 4.63 08/23/2020 1724   HGB 13.8 08/23/2020 1724   HCT 42.4 08/23/2020 1724   PLT 456 (H) 08/23/2020 1724   MCV 91.6 08/23/2020 1724   MCH 29.8 08/23/2020 1724   MCHC 32.5 08/23/2020 1724   RDW 13.2 08/23/2020 1724   LYMPHSABS 2.5 08/23/2020 1724   MONOABS 0.7 08/23/2020 1724   EOSABS 0.2 08/23/2020 1724   BASOSABS 0.1 08/23/2020 1724    BMET    Component Value Date/Time   NA 142 04/07/2023 1607   K 4.3 04/07/2023 1607   CL 104 04/07/2023 1607   CO2 24 04/07/2023 1607   GLUCOSE 82 04/07/2023 1607   GLUCOSE 112 (H) 08/23/2020 1724   BUN 15 04/07/2023 1607   CREATININE 0.92 04/07/2023 1607   CALCIUM 9.4 04/07/2023 1607   GFRNONAA >60 08/23/2020 1724   GFRAA 59 (L) 03/20/2017 1830    BNP No results found for: "BNP"   Imaging:  No results found.  Administration History     None           No data to display          No results found for: "NITRICOXIDE"      Assessment & Plan:     CAP (community acquired pneumonia) Treated at [facility]. Added on azithromycin at last visit. Clinically improved today. Repeat CXR to ensure resolution.  Patient Instructions  Continue Albuterol inhaler 2 puffs or 3 mL neb every 6 hours as needed for shortness of breath or wheezing. Notify if symptoms  persist despite rescue inhaler/neb use. Use nebs 2-3 times a day until symptoms improve Continue Trelegy 1 puff daily. Brush tongue and rinse mouth afterwards Continue singulair 1 tab At bedtime Continue daily allergy pill   -Guaifenesin 600 mg Twice daily over the counter as needed for congestion -Afrin nasal spray over the counter as needed for nasal congestion/allergies. Vanessa Gordon not use longer than 3-5 days -Vanessa Gordon saline lavage/rinse 1-2 times a day, use bottled distilled water. Then follow with your nasal sprays 20-30 minutes later   -If covered, you can switch the flonase and azelastine to fluticasone-azelastine combo spray (Dymista) 1 spray Twice daily. If not  covered, continue to use flonase 2 sprays each nostril daily and azelastine 2 sprays each nostril Twice daily   Have the labs completed by your allergist May consider an injectable asthma medication depending on these  Repeat chest x ray today    Follow up in 6 weeks with Dr. Judeth Horn or Vanessa Camiyah Friberg,NP. If symptoms Vanessa Gordon not improve or worsen, please contact office for sooner follow up or seek emergency care.    Severe persistent asthma She's had multiple exacerbations over the last year. Discussed the role of biologic therapy in managing severe asthma with recurrent exacerbations. She is open to this. Need updated CBC with diff - recently ordered by allergist. She is going to contact them to schedule this among her other labs including allergen panel. Would recommend aspergillus panel to rule out asthma mimic pending IgE and eos results. Action plan in place.   Allergic rhinitis Slight flare. Add on nasal decongestant PRN and saline rinses. Follow up with allergy    Advised if symptoms Vanessa Gordon not improve or worsen, to please contact office for sooner follow up or seek emergency care.   I spent 35 minutes of dedicated to the care of this patient on the date of this encounter to include pre-visit review of records, face-to-face time with the  patient discussing conditions above, post visit ordering of testing, clinical documentation with the electronic health record, making appropriate referrals as documented, and communicating necessary findings to members of the patients care team.  Vanessa Chapel, NP 07/18/2023  Pt aware and understands NP's role.

## 2023-07-18 ENCOUNTER — Encounter: Payer: Self-pay | Admitting: Nurse Practitioner

## 2023-07-18 DIAGNOSIS — J4550 Severe persistent asthma, uncomplicated: Secondary | ICD-10-CM | POA: Insufficient documentation

## 2023-07-18 NOTE — Assessment & Plan Note (Signed)
 Treated at [facility]. Added on azithromycin at last visit. Clinically improved today. Repeat CXR to ensure resolution.  Patient Instructions  Continue Albuterol inhaler 2 puffs or 3 mL neb every 6 hours as needed for shortness of breath or wheezing. Notify if symptoms persist despite rescue inhaler/neb use. Use nebs 2-3 times a day until symptoms improve Continue Trelegy 1 puff daily. Brush tongue and rinse mouth afterwards Continue singulair 1 tab At bedtime Continue daily allergy pill   -Guaifenesin 600 mg Twice daily over the counter as needed for congestion -Afrin nasal spray over the counter as needed for nasal congestion/allergies. Do not use longer than 3-5 days -Do saline lavage/rinse 1-2 times a day, use bottled distilled water. Then follow with your nasal sprays 20-30 minutes later   -If covered, you can switch the flonase and azelastine to fluticasone-azelastine combo spray (Dymista) 1 spray Twice daily. If not covered, continue to use flonase 2 sprays each nostril daily and azelastine 2 sprays each nostril Twice daily   Have the labs completed by your allergist May consider an injectable asthma medication depending on these  Repeat chest x ray today    Follow up in 6 weeks with Dr. Judeth Horn or Katie Anes Rigel,NP. If symptoms do not improve or worsen, please contact office for sooner follow up or seek emergency care.

## 2023-07-18 NOTE — Assessment & Plan Note (Signed)
 She's had multiple exacerbations over the last year. Discussed the role of biologic therapy in managing severe asthma with recurrent exacerbations. She is open to this. Need updated CBC with diff - recently ordered by allergist. She is going to contact them to schedule this among her other labs including allergen panel. Would recommend aspergillus panel to rule out asthma mimic pending IgE and eos results. Action plan in place.

## 2023-07-18 NOTE — Assessment & Plan Note (Signed)
 Slight flare. Add on nasal decongestant PRN and saline rinses. Follow up with allergy

## 2023-07-29 DIAGNOSIS — J4541 Moderate persistent asthma with (acute) exacerbation: Secondary | ICD-10-CM | POA: Diagnosis not present

## 2023-08-07 ENCOUNTER — Telehealth: Payer: Self-pay

## 2023-08-07 DIAGNOSIS — J329 Chronic sinusitis, unspecified: Secondary | ICD-10-CM

## 2023-08-07 DIAGNOSIS — Z91038 Other insect allergy status: Secondary | ICD-10-CM

## 2023-08-07 DIAGNOSIS — J3089 Other allergic rhinitis: Secondary | ICD-10-CM

## 2023-08-07 NOTE — Telephone Encounter (Signed)
 Patient came in to [facility] office for blood work - per [employer] - patient will need to have blood work done by [employer] with the laser machine.  Rachelle stated lab work will need to be re entered so patient can go to [employer] [facility] lab.  Forwarding message to provider as update.

## 2023-08-07 NOTE — Telephone Encounter (Signed)
 Mailing lab orders to patient's home and sent her a [employer].   Thank you.

## 2023-08-07 NOTE — Telephone Encounter (Signed)
I reordered labs.

## 2023-08-11 ENCOUNTER — Other Ambulatory Visit: Payer: Self-pay | Admitting: Nurse Practitioner

## 2023-08-11 DIAGNOSIS — J189 Pneumonia, unspecified organism: Secondary | ICD-10-CM

## 2023-08-11 NOTE — Progress Notes (Signed)
 Chest x ray showed opacities at right lung base but seems to correlate with her previous CT scan chronic changes instead of an infectious process. Since she hasn't had a CT since 2022 and she had concern for recent pneumonia, would recommend HRCT chest to evaluated her bronchiectasis and ensure changes are stable. Schedule appt with Dr. Judeth Horn after HRCT to review.

## 2023-08-11 NOTE — Progress Notes (Signed)
 Spoke with pt and notified of results per [facility]. Pt verbalized understanding and denied any questions.HRCT was ordered.

## 2023-08-13 ENCOUNTER — Ambulatory Visit: Payer: BC Managed Care – PPO | Admitting: Allergy

## 2023-08-18 ENCOUNTER — Ambulatory Visit
Admission: RE | Admit: 2023-08-18 | Discharge: 2023-08-18 | Disposition: A | Discharge status: Home / Self Care | Source: Ambulatory Visit | Attending: Nurse Practitioner | Admitting: Nurse Practitioner

## 2023-08-18 DIAGNOSIS — J189 Pneumonia, unspecified organism: Secondary | ICD-10-CM

## 2023-08-18 DIAGNOSIS — J8410 Pulmonary fibrosis, unspecified: Secondary | ICD-10-CM | POA: Diagnosis not present

## 2023-08-23 DIAGNOSIS — H00015 Hordeolum externum left lower eyelid: Secondary | ICD-10-CM | POA: Diagnosis not present

## 2023-08-23 DIAGNOSIS — J069 Acute upper respiratory infection, unspecified: Secondary | ICD-10-CM | POA: Diagnosis not present

## 2023-09-04 ENCOUNTER — Other Ambulatory Visit: Payer: Self-pay | Admitting: Pulmonary Disease

## 2023-09-04 DIAGNOSIS — R053 Chronic cough: Secondary | ICD-10-CM

## 2023-09-04 DIAGNOSIS — J4540 Moderate persistent asthma, uncomplicated: Secondary | ICD-10-CM

## 2023-09-22 ENCOUNTER — Other Ambulatory Visit: Payer: Self-pay | Admitting: Family Medicine

## 2023-09-22 DIAGNOSIS — K219 Gastro-esophageal reflux disease without esophagitis: Secondary | ICD-10-CM

## 2023-10-02 ENCOUNTER — Ambulatory Visit (INDEPENDENT_AMBULATORY_CARE_PROVIDER_SITE_OTHER): Admitting: Pulmonary Disease

## 2023-10-02 ENCOUNTER — Encounter: Payer: Self-pay | Admitting: Pulmonary Disease

## 2023-10-02 VITALS — BP 141/82 | HR 91 | Ht 68.0 in | Wt 181.0 lb

## 2023-10-02 DIAGNOSIS — J189 Pneumonia, unspecified organism: Secondary | ICD-10-CM

## 2023-10-02 DIAGNOSIS — J302 Other seasonal allergic rhinitis: Secondary | ICD-10-CM

## 2023-10-02 DIAGNOSIS — J4550 Severe persistent asthma, uncomplicated: Secondary | ICD-10-CM | POA: Diagnosis not present

## 2023-10-02 MED ORDER — PREDNISONE 20 MG PO TABS: 40.0000 mg | ORAL_TABLET | Freq: Every day | ORAL | 0 refills | Status: AC

## 2023-10-02 NOTE — Patient Instructions (Signed)
 Nice to see you again  Try the sinus rinse 1-2 times a day until the congestion improves  Continue all the other medicines you are  Take prednisone  as prescribed, see if this helps dry up the congestion  The CT scan is stable, this is great news.  Return to clinic in 6 months or sooner as needed with Dr. Marygrace Snellen

## 2023-10-02 NOTE — Progress Notes (Signed)
 @Patient  ID: Vanessa Gordon, female    DOB: February 01, 1961, 63 y.o.   MRN: [id number]  Chief Complaint  Patient presents with   Follow-up    Referring provider: Veronia Goon, DO  HPI:   63 y.o. whom we are seeing in follow-up of asthma.  Most recent pulmonary note x 2 reviewed via NP.  Doing okay overall.  Fairly well-controlled disease on high-dose Trelegy.  A bit more sinus congestion.  Leading to postnasal drip, increased cough.  Lungs still good breathing good.  CT scan obtained in the interim since last visit with NP.  Reviewed today, interpreted as stable postinflammatory scarring.  Discussed in detail with patient.  HPI at initial visit: At the time, patient complained about 1 week history of shortness of breath, nonproductive cough.  Associate with body aches.  She had a chest x-ray presented by her PCP: Antibiotics.  Did not improve on moxifloxacin.  Recommended come to the ED.  There she denies any fevers.  Was having seasonal allergies.  Endorse some chest discomfort after coughing.  CT scan performed at that time reviewed and interpreted as bilateral upper lobe groundglass but more dense lower lobe infiltrates.  Was placed on antibiotics and sent home.  Gradually symptoms improved.  She has persistent cough and mild dyspnea exertion.  Worse on inclines or stairs.  No time during things are better or worse.  Cough is nonproductive.  She has worsening allergy symptoms, runny nose, watery eyes over the last few weeks.  PMH: Seasonal allergies Surgical history: Cholecystectomy, hysterectomy Family history: Mother CAD, hypertension, asthma Social History: Never smoker, lives in [place] / Pulmonary Flowsheets:   ACT:  Asthma Control Test ACT Total Score  07/17/2023  3:47 PM 22  06/05/2023  1:33 PM 15  04/16/2023  9:00 AM 24   MMRC:     No data to display          Epworth:      No data to display          Tests:   FENO:  No results found  for: "NITRICOXIDE"  PFT:     No data to display          WALK:     06/05/2023    2:13 PM  SIX MIN WALK  Supplimental Oxygen during Test? (L/min) No  Tech Comments: Pt walked at a normal walking speed for lap 1 and lap 3. Pt maintained o2 at 98%. I had the pt speed walk for lap 2, pt maintained o2 at 98%. Pt does not need or qualify for oxygen.    Imaging: Personally reviewed and as per EMR discussion this note. No results found.   Lab Results: Personally reviewed and as per EMR, no anemia CBC    Component Value Date/Time   WBC 10.4 08/23/2020 1724   RBC 4.63 08/23/2020 1724   HGB 13.8 08/23/2020 1724   HCT 42.4 08/23/2020 1724   PLT 456 (H) 08/23/2020 1724   MCV 91.6 08/23/2020 1724   MCH 29.8 08/23/2020 1724   MCHC 32.5 08/23/2020 1724   RDW 13.2 08/23/2020 1724   LYMPHSABS 2.5 08/23/2020 1724   MONOABS 0.7 08/23/2020 1724   EOSABS 0.2 08/23/2020 1724   BASOSABS 0.1 08/23/2020 1724    BMET    Component Value Date/Time   NA 142 04/07/2023 1607   K 4.3 04/07/2023 1607   CL 104 04/07/2023 1607   CO2 24 04/07/2023 1607   GLUCOSE 82 04/07/2023 1607  GLUCOSE 112 (H) 08/23/2020 1724   BUN 15 04/07/2023 1607   CREATININE 0.92 04/07/2023 1607   CALCIUM 9.4 04/07/2023 1607   GFRNONAA >60 08/23/2020 1724   GFRAA 59 (L) 03/20/2017 1830    BNP No results found for: "BNP"  ProBNP No results found for: "PROBNP"  Specialty Problems       Pulmonary Problems   CAP (community acquired pneumonia)   Allergic rhinitis   Severe persistent asthma    Allergies  Allergen Reactions   Honey Bee Venom Protein [Honey Bee Venom]    Penicillins Hives    Has patient had a PCN reaction causing immediate rash, facial/tongue/throat swelling, SOB or lightheadedness with hypotension: Yes  Has patient had a PCN reaction causing severe rash involving mucus membranes or skin necrosis: No  Has patient had a PCN reaction that required hospitalization No  Has patient had a  PCN reaction occurring within the last 10 years: No  If all of the above answers are "NO", then may proceed with Cephalosporin use.  Has patient had a PCN reaction causing immediate rash, facial/tongue/throat swelling, SOB or lightheadedness with hypotension: Yes Has patient had a PCN reaction causing severe rash involving mucus membranes or skin necrosis: No Has patient had a PCN reaction that required hospitalization No Has patient had a PCN reaction occurring within the last 10 years: No If all of the above answers are "NO", then may proceed with Cephalosporin use.   Pollen Extract Cough    Also causes drainage    Immunization History  Administered Date(s) Administered   Influenza,inj,Quad PF,6+ Mos 06/17/2022   Influenza-Unspecified 08/12/2022   PFIZER(Purple Top)SARS-COV-2 Vaccination 05/19/2020    Past Medical History:  Diagnosis Date   Acid reflux    Asthma    Atypical hyperplasia of breast    Borderline hypertension    Community acquired pneumonia 09/01/2020   Constipation 06/17/2022   Recurrent upper respiratory infection (URI)    Seasonal allergies     Tobacco History: Social History   Tobacco Use  Smoking Status Never   Passive exposure: Past  Smokeless Tobacco Never   Counseling given: Not Answered   Continue to not smoke  Outpatient Encounter Medications as of 10/02/2023  Medication Sig   albuterol  (PROVENTIL ) (2.5 MG/3ML) 0.083% nebulizer solution Take 3 mLs (2.5 mg total) by nebulization every 6 (six) hours as needed for wheezing or shortness of breath.   albuterol  (VENTOLIN  HFA) 108 (90 Base) MCG/ACT inhaler Inhale 1-2 puffs into the lungs every 4 (four) hours as needed.   amLODipine  (NORVASC ) 5 MG tablet TAKE 1 TABLET(5 MG) BY MOUTH AT BEDTIME   azelastine  (ASTELIN ) 0.1 % nasal spray Place 2 sprays into both nostrils 2 (two) times daily.   Azelastine -Fluticasone  137-50 MCG/ACT SUSP Place 1 spray into the nose 2 (two) times daily.   benzonatate   (TESSALON ) 200 MG capsule Take 1 capsule (200 mg total) by mouth 3 (three) times daily as needed for cough.   cetirizine (ZYRTEC) 10 MG tablet Take 10 mg by mouth daily.   EPINEPHrine  0.3 mg/0.3 mL IJ SOAJ injection See admin instructions.   fluticasone  (FLONASE) 50 MCG/ACT nasal spray Place 2 sprays into both nostrils daily.   Fluticasone -Umeclidin-Vilant (TRELEGY ELLIPTA ) 200-62.5-25 MCG/ACT AEPB Inhale 1 puff into the lungs daily.   folic acid (FOLVITE) 1 MG tablet Take 1 mg by mouth daily.    HYDROMET 5-1.5 MG/5ML syrup Take 5 mLs by mouth every 4 (four) hours as needed for cough.   montelukast  (SINGULAIR )  10 MG tablet TAKE 1 TABLET(10 MG) BY MOUTH AT BEDTIME   Multiple Vitamin (MULTIVITAMIN) tablet Take 1 tablet by mouth daily.   multivitamin-lutein (OCUVITE-LUTEIN) CAPS capsule Take 1 capsule by mouth daily.   omeprazole  (PRILOSEC) 40 MG capsule TAKE 1 CAPSULE(40 MG) BY MOUTH DAILY   predniSONE  (DELTASONE ) 20 MG tablet Take 2 tablets (40 mg total) by mouth daily with breakfast for 5 days.   promethazine -dextromethorphan (PROMETHAZINE -DM) 6.25-15 MG/5ML syrup Take 5 mLs by mouth 4 (four) times daily as needed for cough.   No facility-administered encounter medications on file as of 10/02/2023.     Review of Systems N/a  Physical Exam  BP (!) 141/82 (BP Location: Left Arm, Patient Position: Sitting, Cuff Size: Normal)   Pulse 91   Ht 5\' 8"  (1.727 m)   Wt 181 lb (82.1 kg)   SpO2 98%   BMI 27.52 kg/m   Wt Readings from Last 5 Encounters:  10/02/23 181 lb (82.1 kg)  07/17/23 184 lb 6.4 oz (83.6 kg)  06/05/23 183 lb 9.6 oz (83.3 kg)  04/16/23 183 lb 8 oz (83.2 kg)  04/07/23 184 lb 6.4 oz (83.6 kg)    BMI Readings from Last 5 Encounters:  10/02/23 27.52 kg/m  07/17/23 28.04 kg/m  06/05/23 27.92 kg/m  04/16/23 28.47 kg/m  04/07/23 28.04 kg/m     Physical Exam General: Well-appearing, no acute distress Eyes: EOMI, no icterus Neck: Supple, no JVP  appreciated Cardiovascular: warm, no edema Pulmonary: Clear to auscultation bilaterally, no wheezing, normal work of breathing Abdomen: Nondistended, bowel sounds present MSK: No synovitis, joint effusion Neuro: Normal gait, no weakness Psych: Normal mood, full affect  Assessment & Plan:   Asthma: Nasal congestion, atopic symptoms.  Much improved now that has access to Trelegy.  To continue.  Sinus allergies, rhinorrhea: With a lot of congestion.  Postnasal drip.  Increase sinus rinses to 1-2 times a day.  Continue intranasal steroid.  Short course of prednisone  prescribed today.  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  Present since pneumonia in 2022.  No further follow-up recommended.  Return in about 6 months (around 04/03/2024) for f/u Dr. Marygrace Snellen.   Guerry Leek, MD 10/02/2023

## 2023-10-13 ENCOUNTER — Encounter: Payer: Self-pay | Admitting: Pulmonary Disease

## 2023-10-14 MED ORDER — IPRATROPIUM BROMIDE 0.06 % NA SOLN: 2.0000 | Freq: Two times a day (BID) | NASAL | 12 refills | Status: AC

## 2023-10-14 NOTE — Telephone Encounter (Signed)
**Note De-identified  [place] Obfuscation** Please advise 

## 2023-10-15 ENCOUNTER — Other Ambulatory Visit: Payer: Self-pay | Admitting: Nurse Practitioner

## 2023-10-15 DIAGNOSIS — J189 Pneumonia, unspecified organism: Secondary | ICD-10-CM

## 2023-10-21 ENCOUNTER — Encounter: Payer: Self-pay | Admitting: *Deleted

## 2023-10-22 ENCOUNTER — Other Ambulatory Visit: Payer: Self-pay | Admitting: Pulmonary Disease

## 2023-12-01 ENCOUNTER — Other Ambulatory Visit: Payer: Self-pay

## 2023-12-01 DIAGNOSIS — K219 Gastro-esophageal reflux disease without esophagitis: Secondary | ICD-10-CM

## 2023-12-01 MED ORDER — OMEPRAZOLE 40 MG PO CPDR: 40.0000 mg | DELAYED_RELEASE_CAPSULE | Freq: Every day | ORAL | 0 refills | Status: DC

## 2023-12-29 ENCOUNTER — Ambulatory Visit (INDEPENDENT_AMBULATORY_CARE_PROVIDER_SITE_OTHER): Admitting: Family Medicine

## 2023-12-29 VITALS — BP 141/97 | HR 87 | Temp 97.7°F | Ht 68.0 in | Wt 178.5 lb

## 2023-12-29 DIAGNOSIS — R1030 Lower abdominal pain, unspecified: Secondary | ICD-10-CM | POA: Diagnosis not present

## 2023-12-29 DIAGNOSIS — I10 Essential (primary) hypertension: Secondary | ICD-10-CM | POA: Diagnosis not present

## 2023-12-29 NOTE — Progress Notes (Signed)
    SUBJECTIVE:   CHIEF COMPLAINT / HPI:   Abdominal pain  -Reports lower abdominal pain and bloating for past few days  -Pain worse with coughing, has chronic cough from allergies, asthma  -No constipation, diarrhea, N/V -No bloody or black stools  -Laying on R side worse -No urinary sx -No fever or sick contacts -Some reduced appetite -Hx gallbladder removal years ago    PERTINENT  PMH / PSH: Asthma   OBJECTIVE:   BP (!) 141/97   Pulse 87   Temp 97.7 F (36.5 C) (Oral)   Ht 5' 8 (1.727 m)   Wt 178 lb 8 oz (81 kg)   SpO2 100%   BMI 27.14 kg/m   General: Well-appearing. Resting comfortably in room. CV: Normal S1/S2. No extra heart sounds. Warm and well-perfused. Pulm: Breathing comfortably on room air. CTAB. No increased WOB. Abd: Normoactive BS. Soft, non-distended. Mild tenderness to palpation of bilateral lower quadrants. No rebound or guarding. Vertical surgical scar present. Pain worsened with flexing of abdominal muscles.  Skin:  Warm, dry. Psych: Pleasant and appropriate.    ASSESSMENT/PLAN:   Assessment & Plan Lower abdominal pain Exam reassuring against acute abdomen. Suspect possible abdominal muscle strain from repeated coughing vs intermittent bloating or indigestion.  - Discussed supportive care measures including tylenol  prn, heating pad prn, stretching prn  - Discussed return precautions  Primary hypertension BP elevated on repeat check to 141/97 today. Asymptomatic. Last took amlodipine  yesterday around lunch. Suspect elevated BP secondary to acute discomfort vs uncontrolled htn on current regimen.  -Cont amlodipine  5 daily  -Plan for PCP follow up soon for BP management  -Return precautions discussed    RTC at earliest convenience with PCP for BP follow up.   Damien Cassis, MD [facility]

## 2023-12-29 NOTE — Assessment & Plan Note (Signed)
 BP elevated on repeat check to 141/97 today. Asymptomatic. Last took amlodipine  yesterday around lunch. Suspect elevated BP secondary to acute discomfort vs uncontrolled htn on current regimen.  -Cont amlodipine  5 daily  -Plan for PCP follow up soon for BP management  -Return precautions discussed

## 2023-12-29 NOTE — Patient Instructions (Signed)
 Thank you for visiting clinic today and allowing us  to participate in your care!  Your abdominal pain may be muscular in nature. Please try Tylenol , heating pads, and stretching as needed for relief. Please let us  know if you pain worsens, you notice blood in your stool, or you are not able to keep food down.    Your blood pressure was a little high today. Please continue taking your medications as prescribed. Go to the ED if you have chest pain, difficulty breathing, or vision changes.   Please schedule an appointment with your PCP at your earliest convenience for blood pressure follow up.   Reach out any time with any questions or concerns you may have - we are here for you!  Damien Cassis, MD [facility] [phone]

## 2024-01-30 DIAGNOSIS — R059 Cough, unspecified: Secondary | ICD-10-CM | POA: Diagnosis not present

## 2024-01-30 DIAGNOSIS — J029 Acute pharyngitis, unspecified: Secondary | ICD-10-CM | POA: Diagnosis not present

## 2024-01-30 DIAGNOSIS — Z20822 Contact with and (suspected) exposure to covid-19: Secondary | ICD-10-CM | POA: Diagnosis not present

## 2024-02-05 DIAGNOSIS — Z1231 Encounter for screening mammogram for malignant neoplasm of breast: Secondary | ICD-10-CM | POA: Diagnosis not present

## 2024-02-18 DIAGNOSIS — N6325 Unspecified lump in the left breast, overlapping quadrants: Secondary | ICD-10-CM | POA: Diagnosis not present

## 2024-02-18 DIAGNOSIS — R928 Other abnormal and inconclusive findings on diagnostic imaging of breast: Secondary | ICD-10-CM | POA: Diagnosis not present

## 2024-02-24 ENCOUNTER — Other Ambulatory Visit: Payer: Self-pay | Admitting: Hematology and Oncology

## 2024-03-02 DIAGNOSIS — I10 Essential (primary) hypertension: Secondary | ICD-10-CM | POA: Diagnosis not present

## 2024-03-04 ENCOUNTER — Other Ambulatory Visit: Payer: Self-pay | Admitting: Family Medicine

## 2024-03-04 DIAGNOSIS — K219 Gastro-esophageal reflux disease without esophagitis: Secondary | ICD-10-CM

## 2024-03-30 ENCOUNTER — Ambulatory Visit: Admitting: Pulmonary Disease

## 2024-03-30 ENCOUNTER — Encounter: Payer: Self-pay | Admitting: Pulmonary Disease

## 2024-03-30 VITALS — BP 135/83 | HR 84 | Temp 98.0°F | Ht 68.0 in | Wt 179.0 lb

## 2024-03-30 DIAGNOSIS — J45901 Unspecified asthma with (acute) exacerbation: Secondary | ICD-10-CM | POA: Diagnosis not present

## 2024-03-30 DIAGNOSIS — J4550 Severe persistent asthma, uncomplicated: Secondary | ICD-10-CM

## 2024-03-30 MED ORDER — PREDNISONE 20 MG PO TABS: ORAL_TABLET | ORAL | 0 refills | Status: AC

## 2024-03-30 NOTE — Progress Notes (Deleted)
 @Patient  ID: Vanessa Gordon, female    DOB: 01-02-1961, 63 y.o.   MRN: [id number]  Chief Complaint  Patient presents with  . Asthma    Pt complains of a dry cough x3 weeks. Denies fever but states when she is cough, it will give her some chest pain labeling 4/10     Referring provider: Tharon Lung, MD  HPI:   63 y.o. whom we are seeing in follow-up of asthma.  Most recent pulmonary note x 2 reviewed via NP.  Doing okay overall.  Fairly well-controlled disease on high-dose Trelegy.  A bit more sinus congestion.  Leading to postnasal drip, increased cough.  Lungs still good breathing good.  CT scan obtained in the interim since last visit with NP.  Reviewed today, interpreted as stable postinflammatory scarring.  Discussed in detail with patient.  HPI at initial visit: At the time, patient complained about 1 week history of shortness of breath, nonproductive cough.  Associate with body aches.  She had a chest x-ray presented by her PCP: Antibiotics.  Did not improve on moxifloxacin.  Recommended come to the ED.  There she denies any fevers.  Was having seasonal allergies.  Endorse some chest discomfort after coughing.  CT scan performed at that time reviewed and interpreted as bilateral upper lobe groundglass but more dense lower lobe infiltrates.  Was placed on antibiotics and sent home.  Gradually symptoms improved.  She has persistent cough and mild dyspnea exertion.  Worse on inclines or stairs.  No time during things are better or worse.  Cough is nonproductive.  She has worsening allergy symptoms, runny nose, watery eyes over the last few weeks.  PMH: Seasonal allergies Surgical history: Cholecystectomy, hysterectomy Family history: Mother CAD, hypertension, asthma Social History: Never smoker, lives in [place] / Pulmonary Flowsheets:   ACT:  Asthma Control Test ACT Total Score  03/30/2024  3:11 PM 16  07/17/2023  3:47 PM 22  06/05/2023  1:33 PM 15    MMRC:     No data to display          Epworth:      No data to display          Tests:   FENO:  No results found for: NITRICOXIDE  PFT:     No data to display          WALK:     06/05/2023    2:13 PM  SIX MIN WALK  Supplimental Oxygen during Test? (L/min) No  Tech Comments: Pt walked at a normal walking speed for lap 1 and lap 3. Pt maintained o2 at 98%. I had the pt speed walk for lap 2, pt maintained o2 at 98%. Pt does not need or qualify for oxygen.    Imaging: Personally reviewed and as per EMR discussion this note. No results found.   Lab Results: Personally reviewed and as per EMR, no anemia CBC    Component Value Date/Time   WBC 10.4 08/23/2020 1724   RBC 4.63 08/23/2020 1724   HGB 13.8 08/23/2020 1724   HCT 42.4 08/23/2020 1724   PLT 456 (H) 08/23/2020 1724   MCV 91.6 08/23/2020 1724   MCH 29.8 08/23/2020 1724   MCHC 32.5 08/23/2020 1724   RDW 13.2 08/23/2020 1724   LYMPHSABS 2.5 08/23/2020 1724   MONOABS 0.7 08/23/2020 1724   EOSABS 0.2 08/23/2020 1724   BASOSABS 0.1 08/23/2020 1724    BMET    Component Value Date/Time  NA 142 04/07/2023 1607   K 4.3 04/07/2023 1607   CL 104 04/07/2023 1607   CO2 24 04/07/2023 1607   GLUCOSE 82 04/07/2023 1607   GLUCOSE 112 (H) 08/23/2020 1724   BUN 15 04/07/2023 1607   CREATININE 0.92 04/07/2023 1607   CALCIUM 9.4 04/07/2023 1607   GFRNONAA >60 08/23/2020 1724   GFRAA 59 (L) 03/20/2017 1830    BNP No results found for: BNP  ProBNP No results found for: PROBNP  Specialty Problems       Pulmonary Problems   CAP (community acquired pneumonia)   Allergic rhinitis   Severe persistent asthma (HCC)    Allergies  Allergen Reactions  . Honey Bee Venom Protein [Honey Bee Venom]   . Penicillins Hives    Has patient had a PCN reaction causing immediate rash, facial/tongue/throat swelling, SOB or lightheadedness with hypotension: Yes  Has patient had a PCN reaction causing  severe rash involving mucus membranes or skin necrosis: No  Has patient had a PCN reaction that required hospitalization No  Has patient had a PCN reaction occurring within the last 10 years: No  If all of the above answers are NO, then may proceed with Cephalosporin use.  Has patient had a PCN reaction causing immediate rash, facial/tongue/throat swelling, SOB or lightheadedness with hypotension: Yes Has patient had a PCN reaction causing severe rash involving mucus membranes or skin necrosis: No Has patient had a PCN reaction that required hospitalization No Has patient had a PCN reaction occurring within the last 10 years: No If all of the above answers are NO, then may proceed with Cephalosporin use.  . Pollen Extract Cough    Also causes drainage    Immunization History  Administered Date(s) Administered  . Influenza,inj,Quad PF,6+ Mos 06/17/2022  . Influenza-Unspecified 08/12/2022  . PFIZER(Purple Top)SARS-COV-2 Vaccination 05/19/2020    Past Medical History:  Diagnosis Date  . Acid reflux   . Asthma   . Atypical hyperplasia of breast   . Borderline hypertension   . Community acquired pneumonia 09/01/2020  . Constipation 06/17/2022  . Recurrent upper respiratory infection (URI)   . Seasonal allergies     Tobacco History: Social History   Tobacco Use  Smoking Status Never  . Passive exposure: Past  Smokeless Tobacco Never   Counseling given: Not Answered   Continue to not smoke  Outpatient Encounter Medications as of 03/30/2024  Medication Sig  . albuterol  (PROVENTIL ) (2.5 MG/3ML) 0.083% nebulizer solution Take 3 mLs (2.5 mg total) by nebulization every 6 (six) hours as needed for wheezing or shortness of breath.  . albuterol  (VENTOLIN  HFA) 108 (90 Base) MCG/ACT inhaler Inhale 1-2 puffs into the lungs every 4 (four) hours as needed.  . amLODipine  (NORVASC ) 5 MG tablet TAKE 1 TABLET(5 MG) BY MOUTH AT BEDTIME  . azelastine  (ASTELIN ) 0.1 % nasal spray Place 2  sprays into both nostrils 2 (two) times daily.  . Azelastine -Fluticasone  137-50 MCG/ACT SUSP Place 1 spray into the nose 2 (two) times daily.  . benzonatate  (TESSALON ) 200 MG capsule TAKE 1 CAPSULE(200 MG) BY MOUTH THREE TIMES DAILY AS NEEDED FOR COUGH  . cetirizine (ZYRTEC) 10 MG tablet Take 10 mg by mouth daily.  . EPINEPHrine  0.3 mg/0.3 mL IJ SOAJ injection See admin instructions.  . fluticasone  (FLONASE) 50 MCG/ACT nasal spray Place 2 sprays into both nostrils daily.  . folic acid (FOLVITE) 1 MG tablet Take 1 mg by mouth daily.   SABRA HYDROMET 5-1.5 MG/5ML syrup Take 5 mLs by  mouth every 4 (four) hours as needed for cough.  SABRA ipratropium (ATROVENT ) 0.06 % nasal spray Place 2 sprays into both nostrils 2 (two) times daily.  . montelukast  (SINGULAIR ) 10 MG tablet TAKE 1 TABLET(10 MG) BY MOUTH AT BEDTIME  . Multiple Vitamin (MULTIVITAMIN) tablet Take 1 tablet by mouth daily.  . multivitamin-lutein (OCUVITE-LUTEIN) CAPS capsule Take 1 capsule by mouth daily.  . omeprazole  (PRILOSEC) 40 MG capsule TAKE 1 CAPSULE(40 MG) BY MOUTH DAILY  . tamoxifen  (NOLVADEX ) 10 MG tablet TAKE 1/2 TABLET(5 MG) BY MOUTH DAILY  . TRELEGY ELLIPTA  200-62.5-25 MCG/ACT AEPB INHALE 1 PUFF INTO THE LUNGS DAILY  . promethazine -dextromethorphan (PROMETHAZINE -DM) 6.25-15 MG/5ML syrup Take 5 mLs by mouth 4 (four) times daily as needed for cough. (Patient not taking: Reported on 03/30/2024)   No facility-administered encounter medications on file as of 03/30/2024.     Review of Systems N/a  Physical Exam  BP 135/83   Pulse 84   Temp 98 F (36.7 C)   Ht 5' 8 (1.727 m) Comment: pt stated  Wt 179 lb (81.2 kg)   SpO2 99% Comment: ra  BMI 27.22 kg/m   Wt Readings from Last 5 Encounters:  03/30/24 179 lb (81.2 kg)  12/29/23 178 lb 8 oz (81 kg)  10/02/23 181 lb (82.1 kg)  07/17/23 184 lb 6.4 oz (83.6 kg)  06/05/23 183 lb 9.6 oz (83.3 kg)    BMI Readings from Last 5 Encounters:  03/30/24 27.22 kg/m  12/29/23  27.14 kg/m  10/02/23 27.52 kg/m  07/17/23 28.04 kg/m  06/05/23 27.92 kg/m     Physical Exam General: Well-appearing, no acute distress Eyes: EOMI, no icterus Neck: Supple, no JVP appreciated Cardiovascular: warm, no edema Pulmonary: Clear to auscultation bilaterally, no wheezing, normal work of breathing Abdomen: Nondistended, bowel sounds present MSK: No synovitis, joint effusion Neuro: Normal gait, no weakness Psych: Normal mood, full affect  Assessment & Plan:   Asthma: Nasal congestion, atopic symptoms.  Much improved now that has access to Trelegy.  To continue.  Sinus allergies, rhinorrhea: Stable, continue rinses, intrasnasal   Postinflammatory fibrosis: Stable, stable on repeat CT scan 08/2023.  Present since pneumonia in 2022.  No further follow-up recommended.  No follow-ups on file.   Vanessa JONELLE Beals, MD 03/30/2024

## 2024-03-30 NOTE — Patient Instructions (Signed)
 For the cough congestion shortness of breath I recommend taking prednisone .  I sent a taper.  Take as prescribed.  I expect it will help in the next 48 to 72 hours.  Hopefully sooner but lets see.  No changes to medication, continue Trelegy, continue albuterol  as needed.  Return to clinic in 3 months or sooner as needed with Dr. Annella

## 2024-03-30 NOTE — Progress Notes (Signed)
 @Patient  ID: Vanessa Gordon, female    DOB: 12-Jan-1961, 63 y.o.   MRN: [id number]  Chief Complaint  Patient presents with   Asthma    Pt complains of a dry cough x3 weeks. Denies fever but states when she is cough, it will give her some chest pain labeling 4/10     Referring provider: Tharon Lung, MD  HPI:   63 y.o. whom we are seeing in follow-up of asthma.  Most recent PCP note reviewed.  Doing okay overall.  Fairly well-controlled disease on high-dose Trelegy.  Sinus congestion not much of a complaint today.  Does report increased cough.  Largely dry but sometimes foamy clear white sputum.  Increased work of breath.  Using Respimat 1-2 times a day.  Was using once a month.  Concern for asthma exacerbation.  Possibly related to lead decomposing, fall pollens etc.  No fever or chills.    HPI at initial visit: At the time, patient complained about 1 week history of shortness of breath, nonproductive cough.  Associate with body aches.  She had a chest x-ray presented by her PCP: Antibiotics.  Did not improve on moxifloxacin.  Recommended come to the ED.  There she denies any fevers.  Was having seasonal allergies.  Endorse some chest discomfort after coughing.  CT scan performed at that time reviewed and interpreted as bilateral upper lobe groundglass but more dense lower lobe infiltrates.  Was placed on antibiotics and sent home.  Gradually symptoms improved.  She has persistent cough and mild dyspnea exertion.  Worse on inclines or stairs.  No time during things are better or worse.  Cough is nonproductive.  She has worsening allergy symptoms, runny nose, watery eyes over the last few weeks.  PMH: Seasonal allergies Surgical history: Cholecystectomy, hysterectomy Family history: Mother CAD, hypertension, asthma Social History: Never smoker, lives in [place] / Pulmonary Flowsheets:   ACT:  Asthma Control Test ACT Total Score  03/30/2024  3:11 PM 16  07/17/2023   3:47 PM 22  06/05/2023  1:33 PM 15   MMRC:     No data to display          Epworth:      No data to display          Tests:   FENO:  No results found for: NITRICOXIDE  PFT:     No data to display          WALK:     06/05/2023    2:13 PM  SIX MIN WALK  Supplimental Oxygen during Test? (L/min) No  Tech Comments: Pt walked at a normal walking speed for lap 1 and lap 3. Pt maintained o2 at 98%. I had the pt speed walk for lap 2, pt maintained o2 at 98%. Pt does not need or qualify for oxygen.    Imaging: Personally reviewed and as per EMR discussion this note. No results found.   Lab Results: Personally reviewed and as per EMR, no anemia CBC    Component Value Date/Time   WBC 10.4 08/23/2020 1724   RBC 4.63 08/23/2020 1724   HGB 13.8 08/23/2020 1724   HCT 42.4 08/23/2020 1724   PLT 456 (H) 08/23/2020 1724   MCV 91.6 08/23/2020 1724   MCH 29.8 08/23/2020 1724   MCHC 32.5 08/23/2020 1724   RDW 13.2 08/23/2020 1724   LYMPHSABS 2.5 08/23/2020 1724   MONOABS 0.7 08/23/2020 1724   EOSABS 0.2 08/23/2020 1724   BASOSABS 0.1  08/23/2020 1724    BMET    Component Value Date/Time   NA 142 04/07/2023 1607   K 4.3 04/07/2023 1607   CL 104 04/07/2023 1607   CO2 24 04/07/2023 1607   GLUCOSE 82 04/07/2023 1607   GLUCOSE 112 (H) 08/23/2020 1724   BUN 15 04/07/2023 1607   CREATININE 0.92 04/07/2023 1607   CALCIUM 9.4 04/07/2023 1607   GFRNONAA >60 08/23/2020 1724   GFRAA 59 (L) 03/20/2017 1830    BNP No results found for: BNP  ProBNP No results found for: PROBNP  Specialty Problems       Pulmonary Problems   CAP (community acquired pneumonia)   Allergic rhinitis   Severe persistent asthma (HCC)    Allergies  Allergen Reactions   Honey Bee Venom Protein [Honey Bee Venom]    Penicillins Hives    Has patient had a PCN reaction causing immediate rash, facial/tongue/throat swelling, SOB or lightheadedness with hypotension: Yes  Has  patient had a PCN reaction causing severe rash involving mucus membranes or skin necrosis: No  Has patient had a PCN reaction that required hospitalization No  Has patient had a PCN reaction occurring within the last 10 years: No  If all of the above answers are NO, then may proceed with Cephalosporin use.  Has patient had a PCN reaction causing immediate rash, facial/tongue/throat swelling, SOB or lightheadedness with hypotension: Yes Has patient had a PCN reaction causing severe rash involving mucus membranes or skin necrosis: No Has patient had a PCN reaction that required hospitalization No Has patient had a PCN reaction occurring within the last 10 years: No If all of the above answers are NO, then may proceed with Cephalosporin use.   Pollen Extract Cough    Also causes drainage    Immunization History  Administered Date(s) Administered   Influenza,inj,Quad PF,6+ Mos 06/17/2022   Influenza-Unspecified 08/12/2022   PFIZER(Purple Top)SARS-COV-2 Vaccination 05/19/2020    Past Medical History:  Diagnosis Date   Acid reflux    Asthma    Atypical hyperplasia of breast    Borderline hypertension    Community acquired pneumonia 09/01/2020   Constipation 06/17/2022   Recurrent upper respiratory infection (URI)    Seasonal allergies     Tobacco History: Social History   Tobacco Use  Smoking Status Never   Passive exposure: Past  Smokeless Tobacco Never   Counseling given: Not Answered   Continue to not smoke  Outpatient Encounter Medications as of 03/30/2024  Medication Sig   albuterol  (PROVENTIL ) (2.5 MG/3ML) 0.083% nebulizer solution Take 3 mLs (2.5 mg total) by nebulization every 6 (six) hours as needed for wheezing or shortness of breath.   albuterol  (VENTOLIN  HFA) 108 (90 Base) MCG/ACT inhaler Inhale 1-2 puffs into the lungs every 4 (four) hours as needed.   amLODipine  (NORVASC ) 5 MG tablet TAKE 1 TABLET(5 MG) BY MOUTH AT BEDTIME   azelastine  (ASTELIN ) 0.1 %  nasal spray Place 2 sprays into both nostrils 2 (two) times daily.   Azelastine -Fluticasone  137-50 MCG/ACT SUSP Place 1 spray into the nose 2 (two) times daily.   benzonatate  (TESSALON ) 200 MG capsule TAKE 1 CAPSULE(200 MG) BY MOUTH THREE TIMES DAILY AS NEEDED FOR COUGH   cetirizine (ZYRTEC) 10 MG tablet Take 10 mg by mouth daily.   EPINEPHrine  0.3 mg/0.3 mL IJ SOAJ injection See admin instructions.   fluticasone  (FLONASE) 50 MCG/ACT nasal spray Place 2 sprays into both nostrils daily.   folic acid (FOLVITE) 1 MG tablet Take 1 mg  by mouth daily.    HYDROMET 5-1.5 MG/5ML syrup Take 5 mLs by mouth every 4 (four) hours as needed for cough.   ipratropium (ATROVENT ) 0.06 % nasal spray Place 2 sprays into both nostrils 2 (two) times daily.   montelukast  (SINGULAIR ) 10 MG tablet TAKE 1 TABLET(10 MG) BY MOUTH AT BEDTIME   Multiple Vitamin (MULTIVITAMIN) tablet Take 1 tablet by mouth daily.   multivitamin-lutein (OCUVITE-LUTEIN) CAPS capsule Take 1 capsule by mouth daily.   omeprazole  (PRILOSEC) 40 MG capsule TAKE 1 CAPSULE(40 MG) BY MOUTH DAILY   predniSONE  (DELTASONE ) 20 MG tablet Take 2 tablets (40 mg total) by mouth daily with breakfast for 5 days, THEN 1 tablet (20 mg total) daily with breakfast for 5 days.   tamoxifen  (NOLVADEX ) 10 MG tablet TAKE 1/2 TABLET(5 MG) BY MOUTH DAILY   TRELEGY ELLIPTA  200-62.5-25 MCG/ACT AEPB INHALE 1 PUFF INTO THE LUNGS DAILY   promethazine -dextromethorphan (PROMETHAZINE -DM) 6.25-15 MG/5ML syrup Take 5 mLs by mouth 4 (four) times daily as needed for cough. (Patient not taking: Reported on 03/30/2024)   No facility-administered encounter medications on file as of 03/30/2024.     Review of Systems N/a  Physical Exam  BP 135/83   Pulse 84   Temp 98 F (36.7 C)   Ht 5' 8 (1.727 m) Comment: pt stated  Wt 179 lb (81.2 kg)   SpO2 99% Comment: ra  BMI 27.22 kg/m   Wt Readings from Last 5 Encounters:  03/30/24 179 lb (81.2 kg)  12/29/23 178 lb 8 oz (81 kg)   10/02/23 181 lb (82.1 kg)  07/17/23 184 lb 6.4 oz (83.6 kg)  06/05/23 183 lb 9.6 oz (83.3 kg)    BMI Readings from Last 5 Encounters:  03/30/24 27.22 kg/m  12/29/23 27.14 kg/m  10/02/23 27.52 kg/m  07/17/23 28.04 kg/m  06/05/23 27.92 kg/m     Physical Exam General: Sitting in chair no distress Eyes: No icterus Neck: No JV P Pulmonary: Clear, no wheeze, normal work of breathing Abdomen: Not distended   Assessment & Plan:   Asthma with acute exacerbation: Usually well-controlled with Trelegy.  Improved control with regular access to Trelegy.  Historically frequent exacerbations prior.  Recent 2-week increased cough shortness of breath increase albuterol  usage.  Clear lungs.  Prednisone  taper sent today.  Sinus allergies, rhinorrhea: Sinus rinses, intranasal steroid.  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  Present since pneumonia in 2022.  No further follow-up recommended.  Return in about 3 months (around 06/30/2024).   Donnice JONELLE Beals, MD 03/30/2024

## 2024-04-11 ENCOUNTER — Encounter: Payer: Self-pay | Admitting: Pulmonary Disease

## 2024-04-12 ENCOUNTER — Ambulatory Visit: Payer: Self-pay | Admitting: Pulmonary Disease

## 2024-04-12 MED ORDER — LEVOFLOXACIN 500 MG PO TABS: 500.0000 mg | ORAL_TABLET | Freq: Every day | ORAL | 0 refills | Status: AC

## 2024-04-12 NOTE — Telephone Encounter (Signed)
 Called and spoke with patient, advised that Dr. Annella had sent in Levofloxacin.  I Advised her to let us  know if she is not better after completing this antibiotic. She verbalized understanding.  Nothing further needed.

## 2024-04-12 NOTE — Telephone Encounter (Signed)
Levofloxacin prescribed.

## 2024-04-12 NOTE — Telephone Encounter (Signed)
 FYI Only or Action Required?: Action required by provider: medication refill request. Pt is requesting levofloxacin . Pt had one tablet leftover and took it the day before yesterday. It seemed to be helpful.  Patient is followed in Pulmonology for Asthma, last seen on 03/30/2024 by Hunsucker, Donnice SAUNDERS, MD.  Called Nurse Triage reporting Cough green mucous.  Symptoms began a week ago.  Interventions attempted: OTC medications: Several otc + inhaler.  Symptoms are: unchanged.  Triage Disposition: See Physician Within 24 Hours  Patient/caregiver understands and will follow disposition?: Yes                     E2C2 Pulmonary Triage - Initial Assessment Questions "Chief Complaint (e.g., cough, sob, wheezing, fever, chills, sweat or additional symptoms) *Go to specific symptom protocol after initial questions. Cough - green mucous  "How long have symptoms been present?" Before 11/18  Have you tested for COVID or Flu? Note: If not, ask patient if a home test can be taken. If so, instruct patient to call back for positive results. No  MEDICINES:   "Have you used any OTC meds to help with symptoms?" Yes If yes, ask "What medications?" Theraflu, sudafed, Mucinex  "Have you used your inhalers/maintenance medication?" Yes If yes, "What medications?" inhaler  If inhaler, ask "How many puffs and how often?" Note: Review instructions on medication in the chart. 1-2 days  OXYGEN: "Do you wear supplemental oxygen?" No If yes, "How many liters are you supposed to use?" NA  "Do you monitor your oxygen levels?" No If yes, What is your reading (oxygen level) today? no  What is your usual oxygen saturation reading?  (Note: Pulmonary O2 sats should be 90% or greater) na                      Copied from CRM #[id number]. Topic: Clinical - Red Word Triage >> Apr 12, 2024  1:18 PM Rozanna G wrote: [facility] that prompted transfer to Nurse Triage:  still coughing and coughing up green mucus Reason for Disposition  [1] Known COPD or other severe lung disease (i.e., bronchiectasis, cystic fibrosis, lung surgery) AND [2] symptoms getting worse (i.e., increased sputum purulence or amount, increased breathing difficulty  Answer Assessment - Initial Assessment Questions 1. ONSET: When did the cough begin?      11/18 2. SEVERITY: How bad is the cough today?      moderate 3. SPUTUM: Describe the color of your sputum (e.g., none, dry cough; clear, white, yellow, green)     green 4. HEMOPTYSIS: Are you coughing up any blood? If Yes, ask: How much? (e.g., flecks, streaks, tablespoons, etc.)     no 5. DIFFICULTY BREATHING: Are you having difficulty breathing? If Yes, ask: How bad is it? (e.g., mild, moderate, severe)      When coughing 6. FEVER: Do you have a fever? If Yes, ask: What is your temperature, how was it measured, and when did it start?     no 7. CARDIAC HISTORY: Do you have any history of heart disease? (e.g., heart attack, congestive heart failure)      no 8. LUNG HISTORY: Do you have any history of lung disease?  (e.g., pulmonary embolus, asthma, emphysema)     yes  10. OTHER SYMPTOMS: Do you have any other symptoms? (e.g., runny nose, wheezing, chest pain)       Runny nose  Protocols used: Cough - Acute Productive-A-AH

## 2024-04-12 NOTE — Telephone Encounter (Signed)
 Dr. Annella, Please advise.  Thank you.

## 2024-05-18 ENCOUNTER — Other Ambulatory Visit: Payer: Self-pay

## 2024-05-18 DIAGNOSIS — I10 Essential (primary) hypertension: Secondary | ICD-10-CM

## 2024-05-19 MED ORDER — AMLODIPINE BESYLATE 5 MG PO TABS: 5.0000 mg | ORAL_TABLET | Freq: Every day | ORAL | 0 refills | Status: AC

## 2024-06-15 ENCOUNTER — Other Ambulatory Visit: Payer: Self-pay | Admitting: Nurse Practitioner

## 2024-06-15 DIAGNOSIS — J189 Pneumonia, unspecified organism: Secondary | ICD-10-CM

## 2024-06-17 ENCOUNTER — Telehealth: Payer: Self-pay

## 2024-06-17 NOTE — Telephone Encounter (Signed)
 Copied from CRM #[id number]. Topic: Clinical - Medication Question >> Jun 17, 2024  8:36 AM Russell PARAS wrote: Reason for CRM:   Pt is contacting clinic regarding her prescribed Trelegy. She is wondering if clinic has any samples avail.   Requested call back  CB#  [phone]   Spoke with patient VBU,  we do not have any Trelegy 200mg  to check back in a few days

## 2024-06-30 ENCOUNTER — Ambulatory Visit: Admitting: Pulmonary Disease
